# Patient Record
Sex: Female | Born: 1980 | Race: White | Hispanic: No | Marital: Married | Smoking: Never smoker
Health system: Southern US, Community
[De-identification: ages and names within clinical notes are randomized; demographics above are authoritative.]

## PROBLEM LIST (undated history)

## (undated) DIAGNOSIS — Z8739 Personal history of other diseases of the musculoskeletal system and connective tissue: Secondary | ICD-10-CM

## (undated) DIAGNOSIS — C55 Malignant neoplasm of uterus, part unspecified: Secondary | ICD-10-CM

## (undated) DIAGNOSIS — R112 Nausea with vomiting, unspecified: Secondary | ICD-10-CM

## (undated) DIAGNOSIS — J45909 Unspecified asthma, uncomplicated: Secondary | ICD-10-CM

## (undated) DIAGNOSIS — N938 Other specified abnormal uterine and vaginal bleeding: Secondary | ICD-10-CM

## (undated) DIAGNOSIS — Z9889 Other specified postprocedural states: Secondary | ICD-10-CM

## (undated) DIAGNOSIS — B880 Other acariasis: Secondary | ICD-10-CM

## (undated) HISTORY — PX: CYSTO: SHX6284

## (undated) HISTORY — DX: Unspecified asthma, uncomplicated: J45.909

---

## 2005-01-18 ENCOUNTER — Ambulatory Visit: Admission: RE | Admit: 2005-01-18 | Discharge: 2005-01-18 | Payer: Self-pay | Admitting: Gynecology

## 2005-01-19 ENCOUNTER — Ambulatory Visit (HOSPITAL_COMMUNITY): Admission: RE | Admit: 2005-01-19 | Discharge: 2005-01-19 | Payer: Self-pay | Admitting: Gynecology

## 2005-01-25 ENCOUNTER — Encounter (INDEPENDENT_AMBULATORY_CARE_PROVIDER_SITE_OTHER): Payer: Self-pay | Admitting: *Deleted

## 2005-01-25 ENCOUNTER — Ambulatory Visit (HOSPITAL_COMMUNITY): Admission: RE | Admit: 2005-01-25 | Discharge: 2005-01-25 | Payer: Self-pay | Admitting: Gynecologic Oncology

## 2005-01-25 HISTORY — PX: OTHER SURGICAL HISTORY: SHX169

## 2005-04-20 ENCOUNTER — Ambulatory Visit (HOSPITAL_COMMUNITY): Admission: RE | Admit: 2005-04-20 | Discharge: 2005-04-20 | Payer: Self-pay | Admitting: Gynecology

## 2005-06-17 ENCOUNTER — Ambulatory Visit (HOSPITAL_COMMUNITY): Admission: RE | Admit: 2005-06-17 | Discharge: 2005-06-17 | Payer: Self-pay | Admitting: Gynecology

## 2005-06-17 ENCOUNTER — Encounter (INDEPENDENT_AMBULATORY_CARE_PROVIDER_SITE_OTHER): Payer: Self-pay | Admitting: Specialist

## 2005-06-17 HISTORY — PX: OTHER SURGICAL HISTORY: SHX169

## 2005-08-10 ENCOUNTER — Ambulatory Visit: Admission: RE | Admit: 2005-08-10 | Discharge: 2005-08-10 | Payer: Self-pay | Admitting: Gynecology

## 2005-10-17 ENCOUNTER — Ambulatory Visit (HOSPITAL_COMMUNITY): Admission: RE | Admit: 2005-10-17 | Discharge: 2005-10-17 | Payer: Self-pay | Admitting: Gynecology

## 2005-10-21 ENCOUNTER — Encounter (INDEPENDENT_AMBULATORY_CARE_PROVIDER_SITE_OTHER): Payer: Self-pay | Admitting: *Deleted

## 2005-10-21 ENCOUNTER — Ambulatory Visit: Admission: RE | Admit: 2005-10-21 | Discharge: 2005-10-21 | Payer: Self-pay | Admitting: Gynecology

## 2005-10-21 ENCOUNTER — Other Ambulatory Visit: Admission: RE | Admit: 2005-10-21 | Discharge: 2005-10-21 | Payer: Self-pay | Admitting: Gynecology

## 2006-02-21 ENCOUNTER — Ambulatory Visit (HOSPITAL_COMMUNITY): Admission: RE | Admit: 2006-02-21 | Discharge: 2006-02-21 | Payer: Self-pay | Admitting: Gynecology

## 2006-02-24 ENCOUNTER — Ambulatory Visit: Admission: RE | Admit: 2006-02-24 | Discharge: 2006-02-24 | Payer: Self-pay | Admitting: Gynecology

## 2006-09-12 ENCOUNTER — Ambulatory Visit (HOSPITAL_COMMUNITY): Admission: RE | Admit: 2006-09-12 | Discharge: 2006-09-12 | Payer: Self-pay | Admitting: Gynecology

## 2006-09-15 ENCOUNTER — Ambulatory Visit: Admission: RE | Admit: 2006-09-15 | Discharge: 2006-09-15 | Payer: Self-pay | Admitting: Gynecology

## 2007-04-10 ENCOUNTER — Ambulatory Visit: Admission: RE | Admit: 2007-04-10 | Discharge: 2007-04-10 | Payer: Self-pay | Admitting: Gynecology

## 2007-04-10 ENCOUNTER — Other Ambulatory Visit: Admission: RE | Admit: 2007-04-10 | Discharge: 2007-04-10 | Payer: Self-pay | Admitting: Gynecology

## 2007-04-10 ENCOUNTER — Encounter: Payer: Self-pay | Admitting: Gynecology

## 2007-10-02 ENCOUNTER — Ambulatory Visit: Admission: RE | Admit: 2007-10-02 | Discharge: 2007-10-02 | Payer: Self-pay | Admitting: Gynecology

## 2009-02-13 ENCOUNTER — Ambulatory Visit: Admission: RE | Admit: 2009-02-13 | Discharge: 2009-02-13 | Payer: Self-pay | Admitting: Gynecology

## 2009-02-17 ENCOUNTER — Ambulatory Visit (HOSPITAL_COMMUNITY): Admission: RE | Admit: 2009-02-17 | Discharge: 2009-02-17 | Payer: Self-pay | Admitting: Gynecology

## 2009-10-30 ENCOUNTER — Ambulatory Visit (HOSPITAL_COMMUNITY): Admission: RE | Admit: 2009-10-30 | Discharge: 2009-10-30 | Payer: Self-pay | Admitting: Gynecology

## 2009-10-30 ENCOUNTER — Other Ambulatory Visit: Admission: RE | Admit: 2009-10-30 | Discharge: 2009-10-30 | Payer: Self-pay | Admitting: Gynecology

## 2010-10-17 IMAGING — US US TRANSVAGINAL NON-OB
1 series · 14 of 25 positions shown · non-contrast
Comparison: 02/17/2009

CLINICAL DATA: History of endometrial cancer, follow-up

TRANSABDOMINAL AND TRANSVAGINAL ULTRASOUND OF PELVIS
TECHNIQUE: Both transabdominal and transvaginal ultrasound
examinations of the pelvis were performed including evaluation of
the uterus, ovaries, adnexal regions, and pelvic cul-de-sac.

[Series 1: us transvaginal non-ob · 0.24mm/px · 14 of 60 slices shown]
[im 1/60]
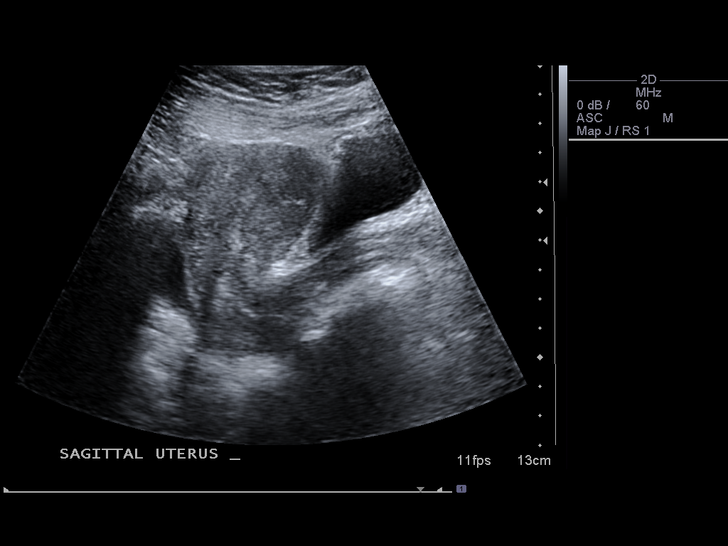
[im 5/60]
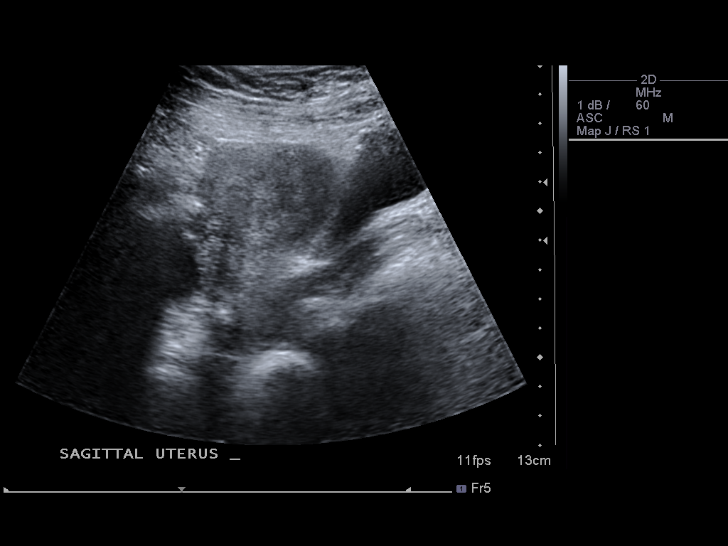
[im 10/60]
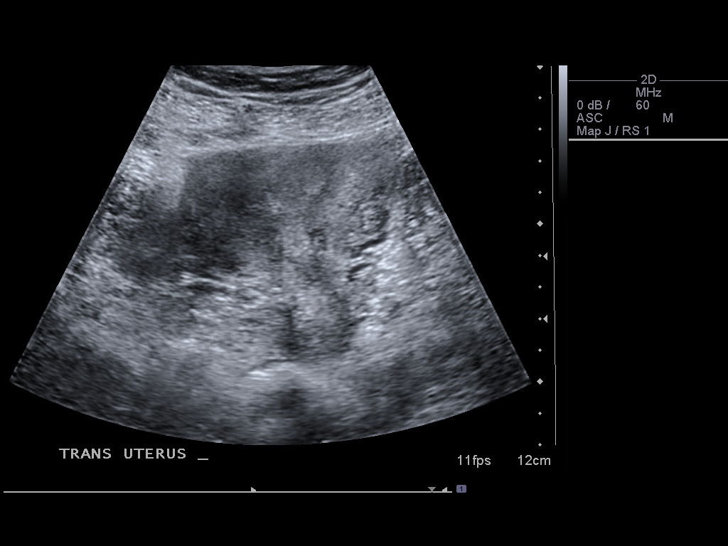
[im 15/60]
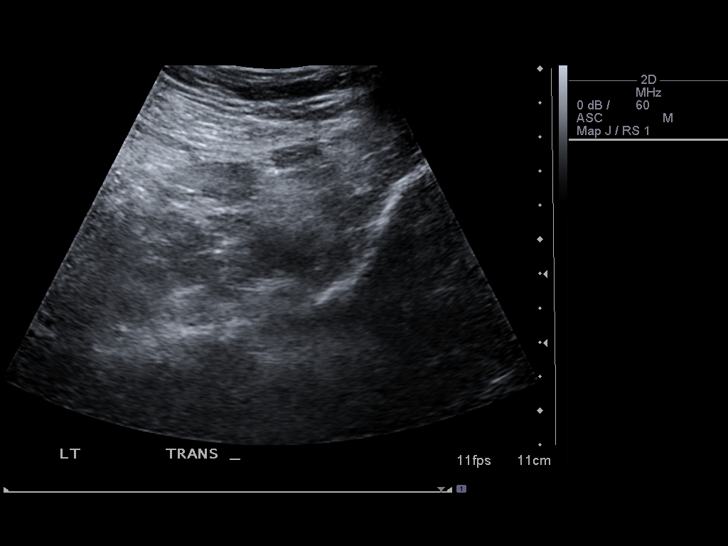
[im 20/60]
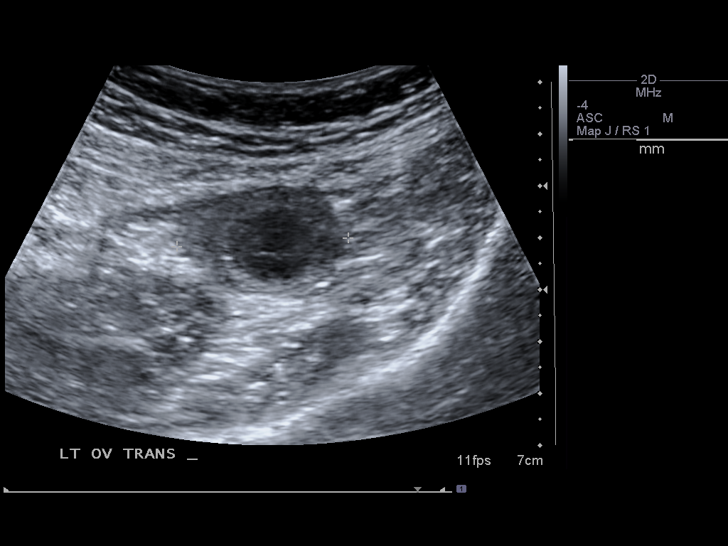
[im 23/60]
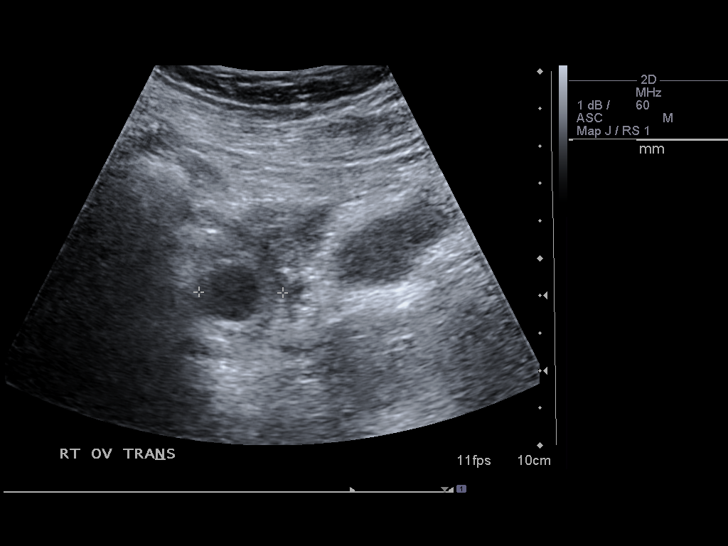
[im 28/60]
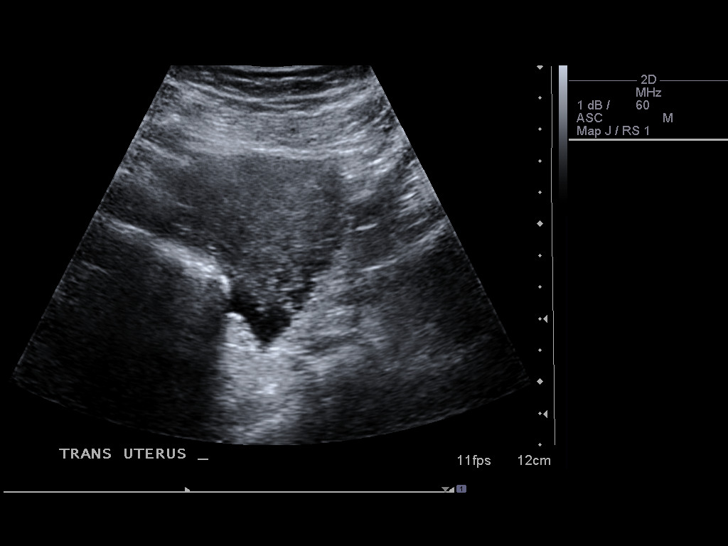
[im 32/60]
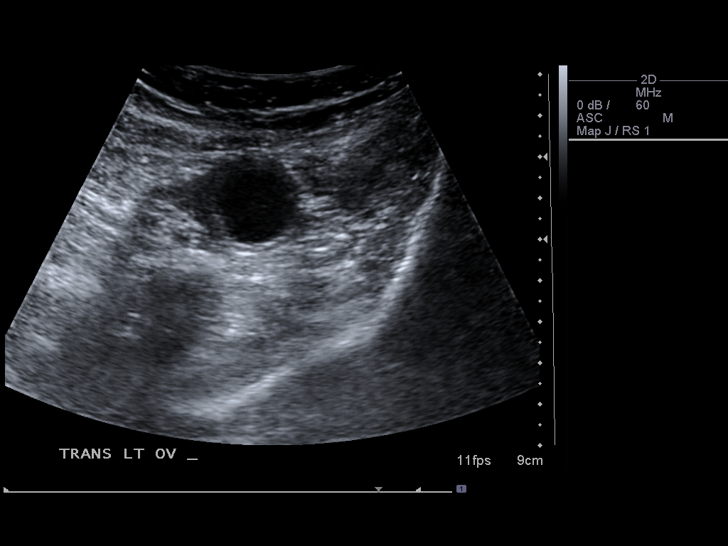
[im 37/60]
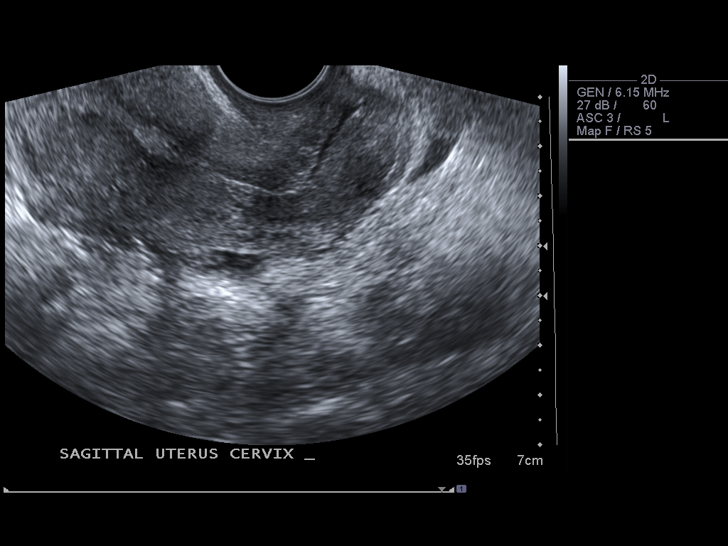
[im 40/60]
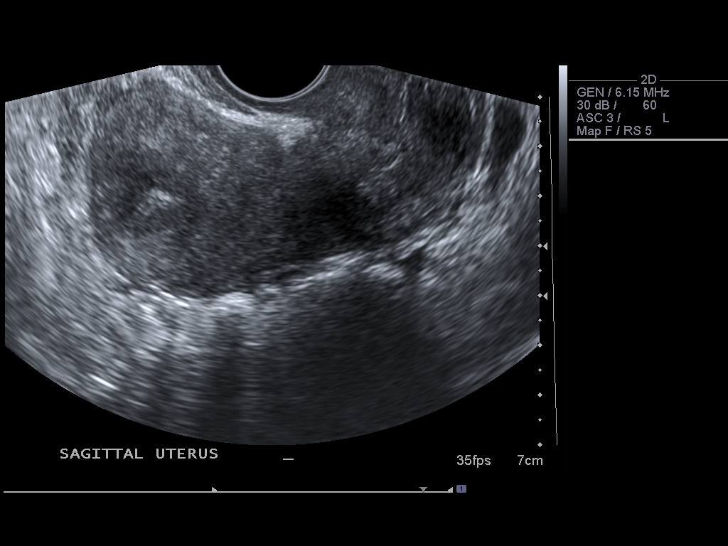
[im 45/60]
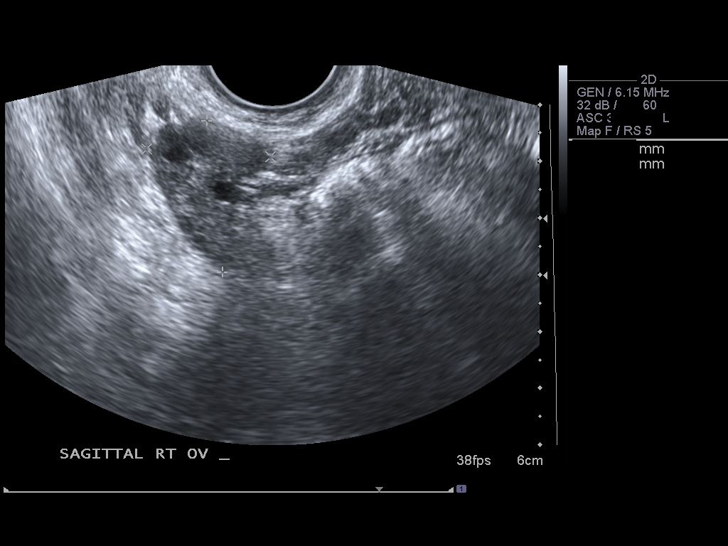
[im 50/60]
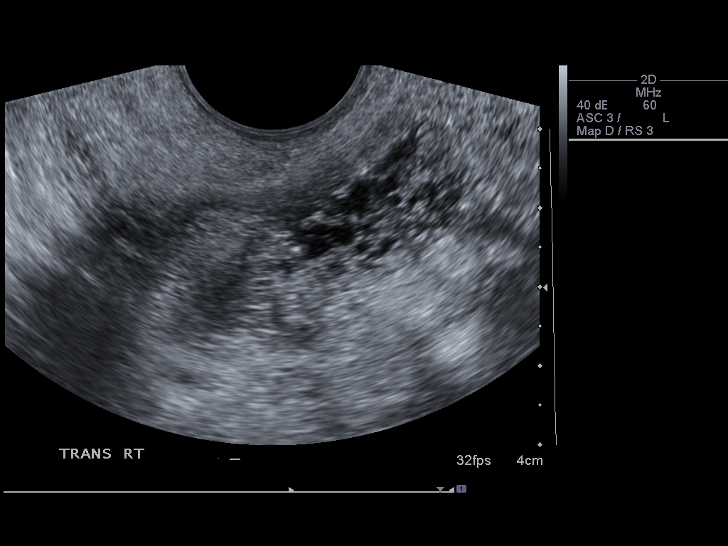
[im 55/60]
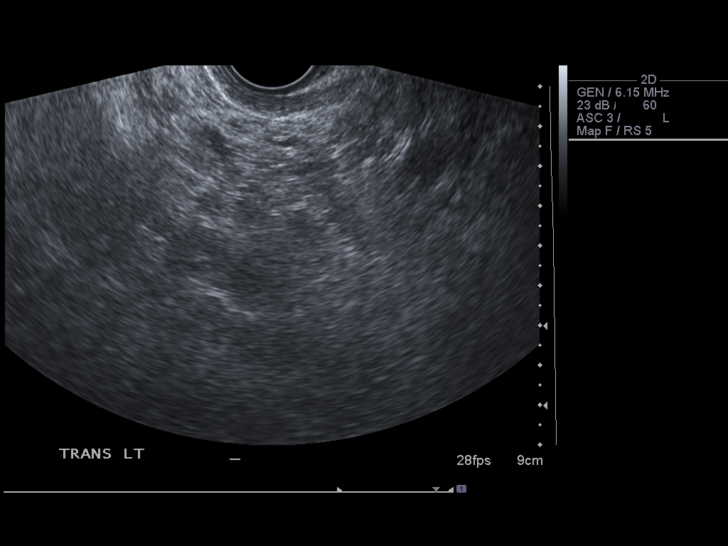
[im 60/60]
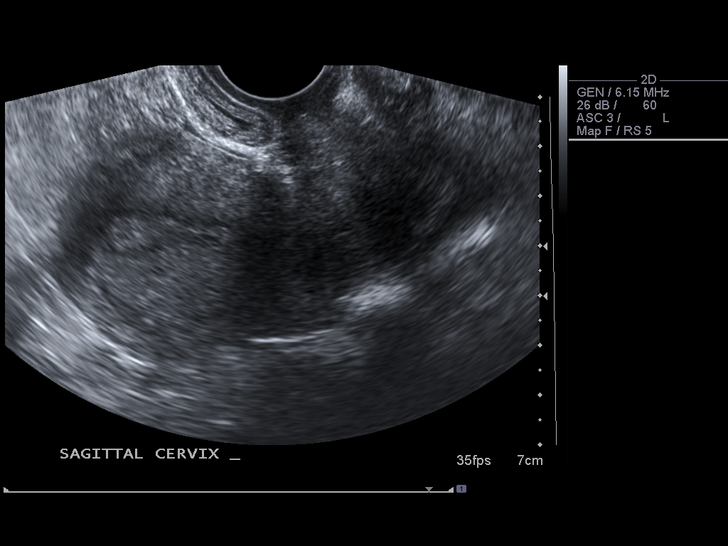

[14 of 25 positions shown; findings below may reference images not displayed]

FINDINGS: Uterus measures 7.2 x 5.1 x 4.0 cm.  Anteverted, anteflexed,
unremarkable.

Endometrium 0.9 cm, uniformly echogenic.

Right Ovary 2.7 x 2.2 x 1.8 cm, normal.

Left Ovary 4.8 x 3.3 x 2.2 cm, normal.

Other Findings:  Small amount of free fluid incidentally noted in
the cul-de-sac.
IMPRESSION: Normal exam.

## 2010-12-11 ENCOUNTER — Encounter: Payer: Self-pay | Admitting: Gynecology

## 2011-04-05 NOTE — Consult Note (Signed)
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   INTERVAL HISTORY:  The patient returns today for continuing gynecologic  followup.  Since our last visit, the patient became pregnant and then  delivered a 5 pound female infant by cesarean section.   Pregnancy was complicated by placenta previa and breech presentation.  The baby spent some time in the NICU but now is at home and doing fine.  The patient herself breast fed only for one month and now is bottle  feeding.  She has had several menstrual periods at regular intervals  since that time.  She is currently using no contraception.   HISTORY OF PRESENT ILLNESS:  The patient initially presented with heavy  vaginal bleeding and underwent a D and C in February 2006.  Final  pathology showed a Mullerian adenosarcoma.  She wished to preserve  fertility and we therefore gave her progestins.  Followup D and C in  March 2009 showed a minute focus of adenosarcoma and a D and C in July  2006 showed benign secretory endometrium but no evidence of sarcoma.  She was then followed with ultrasounds every six months with no  thickening of the endometrial stripe and no abnormalities of the uterus  or cervix.  She subsequently delivered a baby as noted above.   PAST MEDICAL HISTORY:  Medical illnesses: None.   PAST SURGICAL HISTORY:  1. D and C.  2. Cystoscopic bladder surgery as a child.   DRUG ALLERGIES:  NONE.   FAMILY HISTORY:  Grandmother with small cell lung cancer.   CURRENT MEDICATIONS:  None.   SOCIAL HISTORY:  The patient is married.  She works for an [employer].  She does not  smoke.   OBSTETRICAL HISTORY:  Gravida 1.   REVIEW OF SYSTEMS:  A ten point comprehensive review of systems was  negative except as noted above.   PHYSICAL EXAMINATION:  VITAL SIGNS: Weight 157 pounds, blood pressure  100/60.  GENERAL: The patient is a healthy white female, in no acute distress.  HEENT: Negative.  NECK: Supple without thyromegaly.  LYMPH NODES: There is no supraclavicular or inguinal adenopathy.  ABDOMEN: Soft and nontender.  No masses, organomegaly, ascites, or  hernia noted.  Her Pfannenstiel incision is healing well.  She also has  a small incision above the umbilicus where she reports she had a  dysplastic nevus removed during pregnancy.  PELVIC EXAM: EG/BUS, vagina, bladder, urethra are normal.  Cervix  appears normal.  Uterus is anterior, normal shape, size, and  consistency.  There are no adnexal masses noted.  RECTOVAGINAL EXAM:  Confirms.  LOWER EXTREMITIES: Without edema or varicosities.   IMPRESSION:  Mullerian adenosarcoma of the uterus.  No evidence of  recurrent disease.  PLAN:  Ultrasound will be obtained in the next week for a new baseline  and we will repeat it again in six months.   The patient is desirous of having birth control and is given a  prescription for Ortho Tri-Cyclen for both birth control and hopefully  suppression of any endometrial adenosarcoma.   She will return to see me in six months for continuing followup.      De Blanch, M.D.  Electronically Signed     DC/MEDQ  D:  02/13/2009  T:  02/13/2009  Job:  [id number]   cc:   Telford Nab, R.N.  [address]  [place], [place] [address]   Dr. Orma Flaming  [address] [place], [place] [address]

## 2011-04-05 NOTE — Consult Note (Signed)
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   INTERVAL HISTORY:  The patient returns today for continuing followup.  She discontinued using birth control pills in May of 2008.  Subsequently, she has had regular cyclic menstrual periods.  She denies  any intermenstrual bleeding.  She denies any pelvic pain, pressure, or  any GI or GU symptoms.  She is sexually active and she and her husband  in the last 2 to 3 weeks are beginning to attempt pregnancy.   She had a normal ultrasound earlier today that shows a thin endometrial  stripe and no other abnormalities.   HISTORY OF PRESENT ILLNESS:  Patient initially presented with heavy  vaginal bleeding and underwent a D&C in February of 2006.  Final  pathology showed a Mullerian adenosarcoma.  Because she wished to  preserve fertility, we followed her conservatively doing a followup D&C  in March of 2006 showing a minute focus of adenosarcoma.  D&C in July of  2006 was benign secretory endometrium and no evidence of sarcoma.  She  has been followed since that time with serial ultrasounds.  She has a  thin endometrial stripe and no other abnormalities.   PAST MEDICAL HISTORY/MEDICAL ILLNESSES:  None.   PAST SURGICAL HISTORY:  1. D&C.  2. Cystoscopic bladder surgery as a child.   DRUG ALLERGIES:  NONE.   FAMILY HISTORY:  Mother with small cell carcinoma of the lung.  Grandmother with small cell carcinoma of the lung.   CURRENT MEDICATIONS:  None except for a multivitamin with folic acid.   SOCIAL HISTORY:  The patient is married.  She works at an [employer].  Does  not smoke.   OBSTETRICAL HISTORY:  Gravida 0.   REVIEW OF SYSTEMS:  Ten-point comprehensive review of systems negative  except as noted above.   PHYSICAL EXAM:  Weight 148 pounds.  GENERAL:  The patient is a healthy, white female in no acute distress.  HEENT:  Negative.  NECK:  Supple without thyromegaly.  There is no supraclavicular or  inguinal adenopathy.  ABDOMEN:  Soft and nontender.  No masses, organomegaly, ascites, or  hernias noted.  PELVIC:  EG/BUS.  Vagina, bladder, and urethra are normal.  Cervix and  uterus are normal.  There are no adnexal masses.  Rectovaginal exam  confirms.  LOWER EXTREMITIES:  Without edema or varicosities.   IMPRESSION:  Adenosarcoma, being managed conservatively.  No evidence of  recurrent disease following dilation and curettages.   The patient is encouraged to pursue pregnancy.  I will plan on seeing  her back again in 6 months and have repeat CT pelvic ultrasound looking  at the uterus at that time.      De Blanch, M.D.  Electronically Signed     DC/MEDQ  D:  10/02/2007  T:  10/03/2007  Job:  [id number]   cc:   Telford Nab, R.N.  [address]  [place], [place] [address]   Wilmon Pali, M.D.   Buford Dresser, M.D.

## 2011-04-05 NOTE — Consult Note (Signed)
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   INTERVAL HISTORY:  Since her last visit, the patient has done well.  She  has no gynecologic complaints.  She has regular cyclic menstrual  periods.  She continues to use Ortho-Tri-Cyclen.  She and her husband  have not decided when they want to start a family.  She has no  intermenstrual spotting or pelvic pain.   The patient had an ultrasound earlier today that shows a thin  endometrial stripe and no other abnormalities.   HISTORY OF PRESENT ILLNESS:  The patient presented with heavy vaginal  bleeding and underwent a D&C in February of 2006.  Final pathology  showed a mullerian adenosarcoma.  She wished to preserve fertility, and  therefore we took a conservative approach to suppress her endometrium  with progestin and continuing oral contraceptives.  Followup D&C in  March of 2006 showed a minute focus of adenosarcoma.  A D&C in July of  2006 showed benign secretory endometrium with no evidence of sarcoma.  She has been followed since that time with ultrasounds showing a thin  endometrial stripe and no abnormalities.  She has also had no new  symptoms.   PAST MEDICAL HISTORY:  Medical illnesses:  None.   PAST SURGICAL HISTORY:  1. D&C.  2. Cystoscopic bladder surgery as a child.   ALLERGIES:  None.   FAMILY HISTORY:  Grandmother with small cell cancer of the lung.  There  is no gynecologic, breast, or colon cancer in the family.   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   OBSTETRICAL HISTORY:  Gravida 0.   REVIEW OF SYSTEMS:  A 10-point comprehensive review of systems negative  except as noted above.   PHYSICAL EXAMINATION:  VITAL SIGNS:  Weight 147.5 pounds, blood pressure  114/73, pulse 80, respiratory rate 20.  GENERAL:  The patient is a healthy white female in no acute distress.  HEENT:  Negative.  NECK:  Supple without thyromegaly.  There is no supraclavicular or  inguinal adenopathy.  ABDOMEN:  Soft and nontender.  No masses, organomegaly, ascites, or  hernias noted.  PELVIC EXAM:  EG-BUS and vagina regular, urethra normal, the cervix and  uterus are normal, the adnexa are without masses.  LOWER EXTREMITIES:  Without edema or varicosities.   IMPRESSION:  Adenosarcoma of the uterus.  No evidence of persistent or  recurrent disease.   PLAN:  Pap smears were obtained.  The patient will continue using the  Ortho-Tri-Cyclen.  She will return to see me in six months.   We did discuss the possibility of  the patient attempting pregnancy.  I  think now with over two years of followup and no evidence of recurrent  disease this would be okay.  The patient and her husband are uncertain  as to their fertility wishes at the present time, and therefore the  patient will continue using oral contraceptives.   She will return to see me in six months.      De Blanch, M.D.  Electronically Signed     DC/MEDQ  D:  04/10/2007  T:  04/10/2007  Job:  [id number]

## 2011-04-08 NOTE — Consult Note (Signed)
[name], [name]             ACCOUNT NO.:  [id number]   MEDICAL RECORD NO.:  [id number]        PATIENT TYPE:  LOUT   LOCATION:                                 FACILITY:   PHYSICIAN:  De Blanch, M.D.DATE OF BIRTH:  05-07-1981   DATE OF CONSULTATION:  DATE OF DISCHARGE:                                   CONSULTATION   A 30 year old white married female seen in consultation regarding management  of an adenosarcoma recently diagnosed at the time of D&C.  The patient  initially present with heavy vaginal bleeding and a lesion protruding  through a dilated cervical os.  She underwent an urgent D&C by her  gynecologist, Buford Dresser, MD, of [facility].  The operative note showed a  relatively large uterus of approximately 9 cm in depth.  Her hemoglobin had  fallen to 6.9 g.  Final pathology has been reviewed by Dr. Al Decant. Young  at [facility] who feels that this is a mullerian  adenosarcoma.  There was a considerable amount of tumor which was polypoid  and in total measured 4.0 x 2.0 x 2.2 cm.  The patient has had an  uncomplicated postoperative course and has minimal bleeding.   PAST MEDICAL HISTORY:   MEDICAL ILLNESSES:  None.   PAST SURGICAL HISTORY:  1.  D&C.  2.  Some form of cystoscopic procedure as a child for recurrent urinary      tract infections.   DRUG ALLERGIES:  None.   FAMILY HISTORY:  The patient has a grandmother with small cell lung cancer.   CURRENT MEDICATIONS:  Oral contraceptives and iron b.i.d.   SOCIAL HISTORY:  The patient has been married since September 2005.  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   OBSTETRICAL HISTORY:  Gravida 0.   REVIEW OF SYSTEMS:  Otherwise negative.   PHYSICAL EXAMINATION:  VITAL SIGNS:  Weight 136 pounds, height 5 feet 6  inches, blood pressure 110/70.  GENERAL:  The patient is a healthy young woman in no acute distress.  HEENT:  Negative.  NECK:  Supple  without thyromegaly.  There is no supraclavicular or inguinal  adenopathy.  ABDOMEN:  Soft, nontender.  No masses, organomegaly, ascites, or hernias are  noted.  PELVIC:  EG/BUS, vagina, bladder, urethra are normal.  Cervix appears  normal, and there are no lesions noted.  Uterus is anterior, normal shape,  size, and consistency.  Adnexa without masses.  Rectovaginal exam confirms.   IMPRESSION:  Mullerian adenosarcoma of the uterus, status post D&C.  I  indicated to the patient, her mother, husband, and mother-in-law that the  standard care for these lesions would be to perform a hysterectomy.  Obviously this is devastating news to a young woman who wishes to preserve  fertility.  We did discuss alternative approaches to managing this lesion  and although they are not considered standard, the patient is willing to  accept some risks involved.  Taking an alternative approach, I would suggest  the patient undergo an MRI of the pelvis to evaluate for any metastatic  disease and even more importantly, to determine whether there is any  significant myometrial invasion.  If there was myometrial invasion or  involvement, then the hysterectomy would be, I think, our only reasonable  choice.  On the other hand, if she did not have myometrial involvement, I  would recommend she have a follow up D&C with aggressive attempts to  curettage the endometrial cavity and to ascertain whether there is any  residual disease.  Estrogen and progesterone receptors would be obtained.  If she is ER/PR positive, then I would attempt to suppress her with  progestins for 3 months and then repeat an MRI and a D&C for further  histologic evaluation.  All the participants of this discussion including  the patient understand that this is not the standard of care but would be  our attempt to preserve fertility, accepting some risks of persistent or  recurrent disease.  All of their questions are answered.  We will  arrange  for her to have an MRI in the next week or so, and then we will schedule the  [facility] thereafter if the MRI is favorable.   All of their questions are answered.      DC/MEDQ  D:  01/18/2005  T:  01/18/2005  Job:  [id number]   cc:   Buford Dresser, MD  [facility] Gynecology [facility]  [address]  [place]  [place] [address]   Telford Nab, R.N.  [address]  [place], [place] [address]

## 2011-04-08 NOTE — Consult Note (Signed)
[name], [name]             ACCOUNT NO.:  [id number]   MEDICAL RECORD NO.:  [id number]          PATIENT TYPE:  OUT   LOCATION:  GYN                          FACILITY:  [facility]   PHYSICIAN:  De Blanch, M.D.DATE OF BIRTH:  Apr 16, 1981   DATE OF CONSULTATION:  02/24/2006  DATE OF DISCHARGE:                                   CONSULTATION   CHIEF COMPLAINT:  Mullerian adenocarcinoma of the uterus.   HISTORY (INTERVAL) OF PRESENT ILLNESS:  Since her last visit, the patient  has done well. She continues to take Ortho-Tri-Cyclen for contraception and  suppression of her endometrium. She has had regular cyclic menses, although  she had heavier menstrual period in February. She denies any pelvic pain or  pressure, any other GI or GU symptoms.   The patient had a followup ultrasound on February 21, 2006, which shows a  sonographically normal appearing uterus and a 4 mm endometrial strip.  Ovaries appear normal. There is no abnormalities noted in the pelvis.   HISTORY OF PRESENT ILLNESS:  The patient initially presented with heavy  vaginal bleeding and underwent a D&C in February 2006. Final pathology  showed a mullerian adenosarcoma. She was strongly desirous of preserving  fertility. A followup D&C in March of 2006 showed a minuet focus of residual  adenosarcoma. We then subsequently placed the patient on hormonal  suppression. A second D&C in July showed benign secretory endometrium with  no evidence of sarcoma. She has continued to be suppressed with Ortho-Tri-  Cyclen, having regular cyclic menstrual periods and followup ultrasounds  that are showing no uterine abnormalities.   PAST MEDICAL HISTORY/MEDICAL ILLNESSES:  None.   PAST SURGICAL HISTORY:  D&C, cystoscopic bladder surgery as a child.   ALLERGIES:  NO KNOWN DRUG ALLERGIES.   FAMILY HISTORY:  Grandmother with small cell lung cancer.   CURRENT MEDICATIONS:  1.  Ortho-Tri-Cyclen.  2.  Iron.   SOCIAL HISTORY:   The patient is married. 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 She and her husband are continuing to  restore their home in [place].   OBSTETRIC HISTORY:  Gravida O.   REVIEW OF SYSTEMS:  Ten point comprehensive review of systems negative  except as noted above.   PHYSICAL EXAMINATION:  VITAL SIGNS:  Weight 142 pounds. Blood pressure  110/70, pulse 80, respiratory rate 20.  GENERAL:  A healthy white young woman in no acute distress.  HEENT:  Negative.  NECK:  Supple without thyromegaly. There is no supraclavicular or inguinal  adenopathy.  ABDOMEN:  Soft, nontender. No masses, organomegaly, ascites, or hernias are  noted.  PELVIC:  EG, BUS, vagina, bladder, and urethra are normal. Cervix appears  normal. No lesions are noted. Bi-manual reveals a small uterus which seems  to be retroverted today. No adnexal masses noted.  RECTAL:  Recto-vaginal examination confirms.   IMPRESSION:  Mullerian adenosarcoma of the uterus. No evidence of recurrent  disease. Ultrasound is very reassuring.   PLAN:  She will continue using Ortho-Tri-Cyclen. We will repeat her  ultrasound in 6 months.  De Blanch, M.D.  Electronically Signed     DC/MEDQ  D:  02/24/2006  T:  02/25/2006  Job:  [id number]   cc:   Telford Nab, R.N.  [address]  [place], [place] [address]   Betsy English   [address]  [place], [place].

## 2011-04-08 NOTE — Consult Note (Signed)
[name], BILELLO             ACCOUNT NO.:  [id number]   MEDICAL RECORD NO.:  [id number]          PATIENT TYPE:  AMB   LOCATION:  DAY                          [facility]  [facility]   PHYSICIAN:  De Blanch, M.D.DATE OF BIRTH:  [birth year]   DATE OF CONSULTATION:  DATE OF DISCHARGE:  01/25/2005                                   CONSULTATION   I contacted the patient today by phone and also talked to her mother,  Junious Dresser.  We have reviewed her pathology from recent D&C, which shows a  minute focus of residual adenosarcoma.   We continue agree with the plan of conservative management in hopes of  preserving fertility.  The patient will remain on Micronor.  She will report  any new symptoms, including bleeding, cramping, or abdominal pain.  If all  goes well, we will repeat an MRI in three months and then perform another  D&C.   All of the patient's questions are answered.   I called the patient's mother, Junious Dresser, separately and had a lengthy  conversation with the patient's approval.  We reviewed a number of issues,  including fertility, and the recommendation of performing a hysterectomy  once the patient had completed her fertility.  She is aware that this is not  a standard recommendation for management of an adenosarcoma, but with hopes  of preserving fertility, we have agreed to take this route.  They understand  that there is no 100% guarantee as to the potential outcomes.      DC/MEDQ  D:  02/07/2005  T:  02/07/2005  Job:  [id number]   cc:   Telford Nab, R.N.  [address]  [place], [place] [address]

## 2011-04-08 NOTE — Op Note (Signed)
NAMENADYA, HOPWOOD             ACCOUNT NO.:  [id number]   MEDICAL RECORD NO.:  [id number]          PATIENT TYPE:  AMB   LOCATION:  DAY                          FACILITY:  [facility]   PHYSICIAN:  De Blanch, M.D.DATE OF BIRTH:  08/10/1981   DATE OF PROCEDURE:  06/17/2005  DATE OF DISCHARGE:                                 OPERATIVE REPORT   PREOPERATIVE DIAGNOSIS:  Adenosarcoma of the uterus currently on progestin  suppression.   POSTOPERATIVE DIAGNOSIS:  Adenosarcoma of the uterus currently on progestin  suppression.   PROCEDURE:  Examination under anesthesia, diagnostic hysteroscopy,  fractional D&C.   SURGEON:  De Blanch, M.D.   ANESTHESIA:  LMA.   SURGICAL FINDINGS:  Examination under anesthesia revealed an anterior uterus  which is upper limits of normal size. The uterus sounded 8 cm anteriorly. At  the time of hysteroscopy, there were some shaggy endometrium noted in the  fundus. The cervix appeared normal.   DESCRIPTION OF PROCEDURE:  The patient was brought to the operating room and  after satisfactory attainment of general anesthesia was placed in the  lithotomy position in candy cane stirrups. The vulva and vagina were prepped  with Betadine, the patient was draped. A weighted speculum was placed and  the cervix was grasped with a tenaculum. The uterus sounded 8 cm anteriorly.  The cervix was then dilated. A hysteroscope was introduced and a survey  obtained of the cervical canal and endometrium with findings noted above.  The hysteroscope was removed and a deficit of 50 mL of infusion medium was  noted. A fractional D&C was performed obtaining separate specimens from the  endocervix and endometrium. Following complete curettage of the endometrium,  Randall stone forceps were introduced and additional endometrial material  collected and submitted to pathology. The tenaculum  was removed, weighted speculum removed, bimanual exam was performed  confirming normal size uterus. Bleeding was minimal. The patient was  awakened from anesthesia and taken to the recovery room in satisfactory  condition. Sponge, needle and instrument counts were correct x2.       DC/MEDQ  D:  06/17/2005  T:  06/17/2005  Job:  [id number]   cc:   Wilmon Pali, M.D.  [address]  [place], [place] [address]   Buford Dresser, M.D.  [address]  [place], [place] [address]   Telford Nab, R.N.  [address]  [place], [place] [address]

## 2011-04-08 NOTE — Consult Note (Signed)
[name], [name]             ACCOUNT NO.:  [id number]   MEDICAL RECORD NO.:  [id number]          PATIENT TYPE:  OUT   LOCATION:  GYN                          FACILITY:  [facility]   PHYSICIAN:  De Blanch, M.D.DATE OF BIRTH:  Oct 31, 1981   DATE OF CONSULTATION:  DATE OF DISCHARGE:  08/10/2005                                   CONSULTATION   CHIEF COMPLAINT:  Adenosarcoma of the endometrium.   INTERVAL HISTORY:  The patient underwent D&C on June 17, 2005, which was  entirely benign.  Since then, she has continued taking Micronor and has  regular cyclic menses.  She had no sequelae from her D&C.   HISTORY OF PRESENT ILLNESS:  Patient underwent an urgent D&C because of  heavy vaginal bleeding on January 05, 2005.  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  The patient strongly desires preserving fertility.  The tumor was E/R and P/R positive, and she was therefore placed on Micronor  with the attempt at using suppression of progesterones.  Prior to initiation  therapy, she had an MRI which showed no evidence of invasive disease.  Subsequently, she underwent a follow-up hysteroscopy and D&C on January 25, 2005, which showed a minute focus of residual adenosarcoma with fragments of  proliferative endometrium.  She continued on Micronor and had a follow-up  MRI on Apr 20, 2005, which showed a normal uterus.  She subsequently  underwent a second D&C on July 28th, which showed benign secretory  endometrium and no evidence of sarcoma.   PAST MEDICAL HISTORY:  Medical illnesses none.   PAST SURGICAL HISTORY:  1.  D&C x3.  2.  Cystoscopic bladder surgery as a child.   DRUG ALLERGIES:  None.   FAMILY HISTORY:  Patient's grandmother had small cell lung cancer.   CURRENT MEDICATIONS:  Micronor.  Iron.   SOCIAL HISTORY:  Patient was married in September, 2005.  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   OBSTETRICAL HISTORY:  Gravida 0.   REVIEW OF SYSTEMS:  Negative except as noted above.   PHYSICAL EXAMINATION:  VITAL SIGNS:  Weight 138 pounds.  ABDOMEN:  Soft and nontender.  No mass, organomegaly, ascites, or hernias  are noted.  PELVIC:  EG/BUS, vagina, bladder, and urethra are normal.  The cervix is  normal.  There are no lesions noted.  There is no bleeding.  The uterus is  anterior and normal in shape, size, and consistency.  There are no adnexal  masses noted.  Rectovaginal exam confirms.   IMPRESSION:  Adenosarcoma of the endometrium, which appears to be remission  following dilatation and curettage and suppression using Micronor.   PLAN:  We will continue Micronor and reassess her endometrial stripe in  December, 2006.  She will report any abnormal bleeding to [place].      De Blanch, M.D.  Electronically Signed     DC/MEDQ  D:  08/16/2005  T:  08/16/2005  Job:  [id number]   cc:   Telford Nab,  R.N.  [address]  [place], [place] [address]   Dr. Tamela Oddi English  [address]  [place], [place] [address]   Tempie Hoist, M.D.  [address]  [place], [place] [address]

## 2011-04-08 NOTE — Op Note (Signed)
[name], [name]             ACCOUNT NO.:  [id number]   MEDICAL RECORD NO.:  [id number]          PATIENT TYPE:  AMB   LOCATION:  DAY                          FACILITY:  [facility]   PHYSICIAN:  Paola A. Duard Brady, MD    DATE OF BIRTH:  17-Oct-1981   DATE OF PROCEDURE:  01/25/2005  DATE OF DISCHARGE:                                 OPERATIVE REPORT   PREOPERATIVE DIAGNOSIS:  Adenosarcoma of the endometrium.   POSTOPERATIVE DIAGNOSIS:  Adenosarcoma of the endometrium.   PROCEDURE:  Hysteroscopy, endocervical curettage, D&C.   SURGEON:  Paola A. Duard Brady, M.D.   ASSISTANT:  None.   ANESTHESIA:  General.   ESTIMATED BLOOD LOSS:  Minimal.   FLUIDS:  Hysteroscopic fluid in 300 cc, hysteroscopic fluid out 270 cc.   FINDINGS:  No evidence of disease within the endometrial cavity.  Primarily  blood with minimal tissue obtained at the time of D&C.   DESCRIPTION OF PROCEDURE:  The patient was taken to the operating room after  proper identification and review of consent.  She was placed in the supine  position where general anesthesia was induced.  She was then placed in the  dorsal lithotomy position with her legs in Annetta North stirrups, with all  appropriate precautions.  The perineum was prepped in the usual sterile  fashion.  A Foley catheter was inserted to drain the bladder as a straight  drain.   A speculum was then placed into the vagina.  There were no gross visible  lesions.  The cervix was identified and grasped with a single-tooth  tenaculum.  The endometrial cavity sounded to 8 cm.  The cervix was dilated  to 25 without difficulty.  Hysteroscopy was then performed.  There were no  gross visible lesions within the endometrial canal.  Endocervical curettage  was then performed after removal of the hysteroscope, and D&C was also  performed.  Hysteroscopy was then repeated post-completion of the D&C.  There was a remaining shaggy endometrium.  The  hysteroscope was removed.  The  single-tooth tenaculum was removed, and there  was no bleeding from the tenaculum site.   She tolerated the procedure well.  I&Os were as above.  The patient went to  the recovery room in stable condition.      PAG/MEDQ  D:  01/25/2005  T:  01/25/2005  Job:  [id number]

## 2011-04-08 NOTE — Consult Note (Signed)
NAMESRINIDHI, LANDERS             ACCOUNT NO.:  [id number]   MEDICAL RECORD NO.:  [id number]          PATIENT TYPE:  OUT   LOCATION:  GYN                          FACILITY:  [facility]   PHYSICIAN:  De Blanch, M.D.DATE OF BIRTH:  [birth year]   DATE OF CONSULTATION:  10/21/2005  DATE OF DISCHARGE:                                   CONSULTATION   CHIEF COMPLAINT:  Adenosarcoma of the uterus.   INTERVAL HISTORY:  Since her last visit, the patient has been using Orth Tri-  Cyclen. She has regular cyclic menses. She denies any intermenstrual  bleeding. She denies any pelvic pain, pressure or any other GI or GU  symptoms. She had a followup ultrasound on November 27 which showed an  endometrial stripe measuring 5 mm, no masses or fluid collection was seen.  Overall she is doing quite well.   HISTORY OF PRESENT ILLNESS:  The patient initially presented with heavy  vaginal bleeding and had a D&C on January 05, 2005. Final pathology showed  this to be a mullerian adenocarcinoma. The patient strongly desires to  preserve infertility. The tumor is ER and PR positive and we therefore  placed her on Micronor initially. Subsequently because of poor tolerance of  Micronor, we switched her to [employer]. Followup MRIs in March and May  of 2006 showed no increased thickening and an ultrasound on November 27  showed a normal endometrial stripe. She underwent a hysteroscopy D&C January 25, 2005 which showed a minute focus of residual adenosarcoma with fragments  of proliferative endometrium, second D&C in July showed benign secretory  endometrium with no evidence of sarcoma. We are continuing to follow the  patient conservatively with close surveillance.   PAST MEDICAL HISTORY:  Medical illnesses none.   PAST SURGICAL HISTORY:  D&C x3, cystoscopic bladder surgery as a child.   DRUG ALLERGIES:  None.   FAMILY HISTORY:  The patient's grandmother had small cell lung cancer.   CURRENT  MEDICATIONS:  Cathren Harsh and iron.   SOCIAL HISTORY:  The patient is married. 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 She and her husband are currently  restoring an old house.   OBSTETRICAL HISTORY:  Gravida 0.   REVIEW OF SYMPTOMS:  A 10 point comprehensive review of systems is negative  except as noted above.   PHYSICAL EXAMINATION:  VITAL SIGNS:  Weight 144 pounds, blood pressure  110/70, pulse 80, respiratory rate 20.  GENERAL:  The patient is a healthy young woman in no acute distress.  HEENT:  Negative.  NECK:  Supple without thyromegaly.  ABDOMEN:  Soft, nontender, no masses, organomegaly, ascites or hernias are  noted.  PELVIC:  EGBUS, vagina, bladder, urethra are normal. Cervix is nulliparous,  appears normal. Pap smears are obtained. Bimanual reveals a small uterus,  anterior, normal shape, size and consistency. There are no adnexal masses  noted.   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   PLAN:  We will continue to keep the patient suppressed using Alice Rieger Tri-  Cyclen. She will return in February and have a repeat ultrasound for  assessment of the endometrial stripe. At that juncture if all looks good, we  will increase her interval with surveillance to 6 month intervals.      De Blanch, M.D.  Electronically Signed     DC/MEDQ  D:  10/21/2005  T:  10/21/2005  Job:  [id number]   cc:   Telford Nab, R.N.  [address]  [place], [place] [address]   Betsy English   Buford Dresser  [place], [place]

## 2011-04-08 NOTE — Consult Note (Signed)
[name], [name]             ACCOUNT NO.:  [id number]   MEDICAL RECORD NO.:  [id number]          PATIENT TYPE:  OUT   LOCATION:  GYN                          FACILITY:  [facility]   PHYSICIAN:  De Blanch, M.D.DATE OF BIRTH:  12/06/1980   DATE OF CONSULTATION:  09/15/2006  DATE OF DISCHARGE:                                   CONSULTATION   GYNECOLOGIC/ONCOLOGY CLINIC   CHIEF COMPLAINT:  Mullerian adenosarcoma of the uterus.   INTERVAL HISTORY:  Since her last visit, the patient has done well.  She  continues to take Ortho Tri-Cyclen for contraception and suppression of her  endometrium.  She has no interval bleeding or other pelvic symptoms except  for some discomfort in the last few days in the right lower quadrant.   She has had a followup ultrasound of the uterus on October 23, showing a 5.2  mm endometrial strip, no discrete masses identified and there is no  significant change when compared to her ultrasound of February 21, 2006.  On the  ultrasound, they did note a small functional cyst on the right ovary which  is likely a source of her current pain.   HISTORY OF PRESENT ILLNESS:  The patient initially presented with heavy  vaginal bleeding and underwent a D&C in February 2006.  Final pathology  showed a mullerian adenosarcoma.  She and her husband wish to preserve  fertility and therefore undertook a conservative program suppressing the  endometrium with Progestin containing oral contraceptives.  A followup D&C  in March 2006, showed a minute focus of adenosarcoma and a D&C in July 2006,  showed benign secretory endometrium with no evidence of sarcoma.  She had  been followed since that time.   PAST MEDICAL HISTORY:  No medical illnesses.   PAST SURGICAL HISTORY:  1. D&C.  2. Cystoscopic bladder surgery as a child.   ALLERGIES:  No known drug allergies.   FAMILY HISTORY:  Grandmother with small cell cancer of the lung.   CURRENT MEDICATIONS:  Ortho  Tri-Cyclen and iron.   SOCIAL HISTORY:  The patient is married.  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   PAST OBSTETRICAL HISTORY:  G0.  The patient and her husband are not  currently actively attempting pregnancy because of financial constraints.   REVIEW OF SYSTEMS:  Ten-point comprehensive review of systems is negative  except as noted above.   PHYSICAL EXAMINATION:  VITAL SIGNS:  Blood pressure 110/70, pulse 80,  respirations 20.  GENERAL:  The patient is a healthy, slender, young woman in no acute  distress.  HEENT:  Negative.  NECK:  Supple without thyromegaly.  There is no supraclavicular or inguinal  adenopathy.  ABDOMEN:  Soft, nontender.  No masses, organomegaly, ascites or hernias  noted.  PELVIC:  EG/BUS, vagina, bladder, urethra are normal.  Cervix is  nulliparous.  Uterus is anterior and normal shape, size or consistency.  There are no adnexal masses noted.  EXTREMITIES:  Lower extremities without edema or varicosities.   IMPRESSION:  Mullerian adenosarcoma of the uterus, no evidence of  disease.   PLAN:  The patient will continue her Ortho Tri-Cyclen and we will repeat  another ultrasound in 6 months.      De Blanch, M.D.  Electronically Signed     DC/MEDQ  D:  09/15/2006  T:  09/16/2006  Job:  [id number]   cc:   Telford Nab, R.N.  [address] [place] [address]  [place], [place] [address]   Wilmon Pali, M.D.   Buford Dresser, M.D.  [place], [place]

## 2012-01-06 DIAGNOSIS — G43909 Migraine, unspecified, not intractable, without status migrainosus: Secondary | ICD-10-CM | POA: Insufficient documentation

## 2012-01-06 DIAGNOSIS — J309 Allergic rhinitis, unspecified: Secondary | ICD-10-CM | POA: Insufficient documentation

## 2012-01-06 DIAGNOSIS — J45909 Unspecified asthma, uncomplicated: Secondary | ICD-10-CM | POA: Insufficient documentation

## 2013-06-10 ENCOUNTER — Telehealth: Payer: Self-pay | Admitting: Gynecologic Oncology

## 2013-06-10 NOTE — Telephone Encounter (Signed)
Patient called with complaints of vaginal bleeding.  She states that she has seen Dr. Stanford Breed in the past for uterine adenocarcinoma.  She had been seeing a physician at [facility] for primary care and follow up.  She states that her birth control pills were switched to another medication due to weight gain.  After, she bled for 1 1/2 months straight.  She reports that she had a uterine biopsy that was normal and an ultrasound that recommended a follow up MRI that she did not want due to cost at this time.  She is to contact her physician's office to have the records faxed to our office.  Appointment made with Dr. Stanford Breed for Friday, July 25.  She states that she had been switched back to her previously prescribed BCP but continues to bleed and have abdominal cramping.

## 2013-06-14 ENCOUNTER — Ambulatory Visit: Payer: 59 | Attending: Gynecology | Admitting: Gynecology

## 2013-06-14 ENCOUNTER — Encounter: Payer: Self-pay | Admitting: Gynecology

## 2013-06-14 VITALS — BP 120/80 | HR 64 | Temp 99.0°F | Resp 16 | Ht 65.51 in | Wt 168.5 lb

## 2013-06-14 DIAGNOSIS — N939 Abnormal uterine and vaginal bleeding, unspecified: Secondary | ICD-10-CM | POA: Insufficient documentation

## 2013-06-14 DIAGNOSIS — N938 Other specified abnormal uterine and vaginal bleeding: Secondary | ICD-10-CM

## 2013-06-14 DIAGNOSIS — C55 Malignant neoplasm of uterus, part unspecified: Secondary | ICD-10-CM

## 2013-06-14 DIAGNOSIS — N926 Irregular menstruation, unspecified: Secondary | ICD-10-CM | POA: Insufficient documentation

## 2013-06-14 DIAGNOSIS — Z8542 Personal history of malignant neoplasm of other parts of uterus: Secondary | ICD-10-CM | POA: Insufficient documentation

## 2013-06-14 DIAGNOSIS — Z79899 Other long term (current) drug therapy: Secondary | ICD-10-CM | POA: Insufficient documentation

## 2013-06-14 NOTE — Patient Instructions (Signed)
Continue taking Nortrel 1/35 to complete this current pack and then start another pack. Please contact us if you're still having bleeding into the third week of her new pack of pills.

## 2013-06-14 NOTE — Progress Notes (Signed)
Consult Note: Gyn-Onc   [name] 32 y.o. female  Chief Complaint  Patient presents with  . Adenosarcoma of uterus    New consult     Assessment : Abnormal uterine bleeding of uncertain origin. The most likely etiology secondary to her change in oral contraceptives. Certainly a recurrence of the adenosarcoma is a possibility although the recent uterine ultrasound and endometrial biopsy are reassuring.  Plan: The patient will continue her current birth control pills through this cycle and into another cycle. If she is continuing to bleed near the end of their next cycle of pills she'll contact me and we will have her back for further evaluation. Should we reach that juncture, consideration would be given to either performing a D&C or hysterectomy.     HPI: 32 year old white female who is a former patient of mine last seen by [place] in 2010. The patient's gynecologic history dates back to 2006 when she presented with heavy bleeding. A D&C showed a meal area and adenosarcoma. The patient wished to preserve fertility and was therefore treated with high-dose progestins and followup D&C in March 2006 showing a minute fragment of adenosarcoma. We continued progestins and a repeat D&C in July 2006 was negative we continue to follow patient with ultrasounds every 6 months. She subsequently was able to get pregnant and now has a 33-year-old son. Over the intervening years the patient was taking Ortho Tri-Cyclen and then more recently Nortrel 1/35. She did well throughout the 4 years she took Nortrel. In April 2014 the patient chose to switch oral contraceptives to Seasonale.  Subsequent to starting Seasonale she began to have irregular bleeding nearly every day. This was evaluated on 05/20/2013 with an endometrial biopsy showing benign suppressed endometrium with increased progesterone effect and an ultrasound showing a uterus that measured 7.1 x 5 cm, an endometrial stripe of 9 mm but with no  visualized uterine abnormalities. Both ovaries appeared normal. Pap smear was also normal.  3 weeks ago the patient switched back to taking Nortrel. Since then she's continued to have bleeding. She also has occasional cramping.  Review of Systems:10 point review of systems is negative except as noted in interval history.   Vitals: Blood pressure 120/80, pulse 64, temperature 99 F (37.2 C), temperature source Oral, resp. rate 16, height 5' 5.51" (1.664 m), weight 168 lb 8 oz (76.431 kg), last menstrual period 06/14/2013.  Physical Exam: General : The patient is a healthy woman in no acute distress.  HEENT: normocephalic, extraoccular movements normal; neck is supple without thyromegally  Lynphnodes: Supraclavicular and inguinal nodes not enlarged  Abdomen: Soft, non-tender, no ascites, no organomegally, no masses, no hernias  Pelvic:  EGBUS: Normal female  Vagina: Normal, no lesions , there is some blood in the vagina Urethra and Bladder: Normal, non-tender  Cervix: Normal with no lesions. Uterus: Anterior normal shape, size, consistency. There is no tenderness. Bi-manual examination: Non-tender; no adenxal masses or nodularity  Rectal: normal sphincter tone, no masses, no blood  Lower extremities: No edema or varicosities. Normal range of motion      No Known Allergies  Past Medical History  Diagnosis Date  . Uterine cancer   . Allergy   . Asthma     History reviewed. No pertinent past surgical history.  Current Outpatient Prescriptions  Medication Sig Dispense Refill  . cetirizine (ZYRTEC) 10 MG tablet Take 10 mg by mouth daily.      . fluticasone (FLOVENT HFA) 110 MCG/ACT inhaler Inhale 1 puff  into the lungs as needed.      . norethindrone-ethinyl estradiol 1/35 (ORTHO-NOVUM, NORTREL,CYCLAFEM) tablet Take 1 tablet by mouth daily.       No current facility-administered medications for this visit.    History   Social History  . Marital Status: Married    Spouse  Name: N/A    Number of Children: N/A  . Years of Education: N/A   Occupational History  . Not on file.   Social History Main Topics  . Smoking status: Never Smoker   . Smokeless tobacco: Not on file  . Alcohol Use: 1.2 oz/week    1 Glasses of wine, 1 Shots of liquor per week  . Drug Use: Not on file  . Sexually Active: Yes    Birth Control/ Protection: Pill   Other Topics Concern  . Not on file   Social History Narrative  . No narrative on file    History reviewed. No pertinent family history.    Jeannette Corpus, MD 06/14/2013, 4:23 PM

## 2013-06-19 ENCOUNTER — Telehealth: Payer: Self-pay | Admitting: Gynecologic Oncology

## 2013-06-19 ENCOUNTER — Encounter: Payer: Self-pay | Admitting: Gynecology

## 2013-06-19 DIAGNOSIS — R109 Unspecified abdominal pain: Secondary | ICD-10-CM

## 2013-06-19 MED ORDER — TRAMADOL HCL 50 MG PO TABS: 50.0000 mg | ORAL_TABLET | Freq: Four times a day (QID) | ORAL | Status: DC | PRN

## 2013-06-19 NOTE — Telephone Encounter (Signed)
Patient called with complaints of moderate abdominal cramping.  She states that she would like something stronger than ibuprofen for pain since she is unable to sleep.  No known allergies reported.  Informed that Tramadol would be sent to [facility] in [facility] with instructions to take one tablet every six hours as needed for moderate pain.  Advised to call for any questions or concerns.  She is to call the office if the symptoms worsen or persist.

## 2013-07-24 DIAGNOSIS — Z87898 Personal history of other specified conditions: Secondary | ICD-10-CM | POA: Insufficient documentation

## 2013-09-26 ENCOUNTER — Other Ambulatory Visit: Payer: Self-pay

## 2014-01-01 ENCOUNTER — Telehealth: Payer: Self-pay | Admitting: *Deleted

## 2014-01-01 NOTE — Telephone Encounter (Signed)
Pt called with concerns states " I've been having some spotting, cramping and pain for about 3 weeks." Pt denies pain with intercourse, spotting is brown, cramping pain is intermittent. Pt due for PAP in April, Next avail appt with Dr. Judeth Porch March 3, recommended pt could call and make an appt with [facility] to be seen sooner or call her GYN to be further evaluated. Pt verbalized understanding, advised she did not have a way to write down number and will call back in am for [facility] #. In the mean time she will call her GYN.

## 2014-01-20 ENCOUNTER — Telehealth: Payer: Self-pay | Admitting: *Deleted

## 2014-01-20 NOTE — Telephone Encounter (Signed)
Call from Dr. C-P, advised pt will be seen tomorrow at [place] pt confirmed appt date/time. Pt verbalized understanding

## 2014-01-21 ENCOUNTER — Encounter: Payer: Self-pay | Admitting: Gynecology

## 2014-01-21 ENCOUNTER — Other Ambulatory Visit (HOSPITAL_COMMUNITY)
Admission: RE | Admit: 2014-01-21 | Discharge: 2014-01-21 | Disposition: A | Discharge status: Home / Self Care | Payer: 59 | Source: Ambulatory Visit | Attending: Gynecology | Admitting: Gynecology

## 2014-01-21 ENCOUNTER — Ambulatory Visit: Payer: 59 | Attending: Gynecology | Admitting: Gynecology

## 2014-01-21 DIAGNOSIS — N938 Other specified abnormal uterine and vaginal bleeding: Secondary | ICD-10-CM

## 2014-01-21 DIAGNOSIS — N898 Other specified noninflammatory disorders of vagina: Secondary | ICD-10-CM | POA: Insufficient documentation

## 2014-01-21 DIAGNOSIS — Z79899 Other long term (current) drug therapy: Secondary | ICD-10-CM | POA: Insufficient documentation

## 2014-01-21 DIAGNOSIS — J45909 Unspecified asthma, uncomplicated: Secondary | ICD-10-CM | POA: Insufficient documentation

## 2014-01-21 DIAGNOSIS — Z01419 Encounter for gynecological examination (general) (routine) without abnormal findings: Secondary | ICD-10-CM | POA: Insufficient documentation

## 2014-01-21 DIAGNOSIS — C55 Malignant neoplasm of uterus, part unspecified: Secondary | ICD-10-CM | POA: Insufficient documentation

## 2014-01-21 NOTE — Progress Notes (Signed)
Consult Note: Gyn-Onc   [name] 33 y.o. female  Chief Complaint  Patient presents with  . Adnenosarcoma of uterus    Assessment : Abnormal uterine bleeding of uncertain origin. Past history of adenosarcoma of the uterus. Plan: A Pap smear is obtained. Am unable to identify the cervix and therefore unable to perform an endometrial biopsy. Patient had considerable amount discomfort with the speculum exam.  Pap smears obtained  We'll schedule an ultrasound of the pelvis to further delineate anatomy of the uterus tubes and ovaries and cervix. We will likely need to perform a D&C. The patient is inclined to proceed with hysterectomy but if indicated we will not schedule any hysterectomy 2 with complete a D&C and this workup.  Interval history. Patient returns today complaining of one month of continuous vaginal bleeding. Actually her problems started back in December when she had some bleeding which then became a brown discharge. Proximal to go she developed some acute right lower quadrant pain and the bleeding became increased. She is also passed some "tissue". Today she still bleeding but not having any significant pelvic pain.  Through the fall of 2014 the patient had regular cyclic menses take her birth control pills. She continued take birth control pills without any interruption since that time including currently while she's been having heavy bleeding.    HPI: 33-year-old white female who is a former patient of mine last seen by us in 2014. The patient's gynecologic history dates back to 2006 when she presented with heavy bleeding. A D&C showed an adenosarcoma. The patient wished to preserve fertility and was therefore treated with high-dose progestins and followup D&C in March 2006 showing a minute fragment of adenosarcoma. We continued progestins and a repeat D&C in July 2006 was negative we continue to follow patient with ultrasounds every 6 months. She subsequently was able to  get pregnant and now has a 5-year-old son. Over the intervening years the patient was taking Ortho Tri-Cyclen and then more recently Nortrel 1/35. She did well throughout the 4 years she took Nortrel. In April 2014 the patient chose to switch oral contraceptives to Seasonale.  Subsequent to starting Seasonale she began to have irregular bleeding nearly every day. This was evaluated on 05/20/2013 with an endometrial biopsy showing benign suppressed endometrium with increased progesterone effect and an ultrasound showing a uterus that measured 7.1 x 5 cm, an endometrial stripe of 9 mm but with no visualized uterine abnormalities. Both ovaries appeared normal. Pap smear was also normal.    Review of Systems:10 point review of systems is negative except as noted in interval history.   Vitals: There were no vitals taken for this visit.  Physical Exam: General : The patient is a healthy woman in no acute distress.  HEENT: normocephalic, extraoccular movements normal; neck is supple without thyromegally  Lynphnodes: Supraclavicular and inguinal nodes not enlarged  Abdomen: Soft, non-tender, no ascites, no organomegally, no masses, no hernias  Pelvic:  EGBUS: Normal female  Vagina: Normal, no lesions , there is some blood in the vagina, the patient is not tolerate a speculum exam very well. Urethra and Bladder: Normal, non-tender  Cervix: Unable to visualize. Uterus: Difficult to palpate There is no tenderness. Bi-manual examination: Non-tender; no adenxal masses or nodularity  Rectal: normal sphincter tone, no masses, no blood  Lower extremities: No edema or varicosities. Normal range of motion      No Known Allergies  Past Medical History  Diagnosis Date  . Uterine   cancer   . Allergy   . Asthma     No past surgical history on file.  Current Outpatient Prescriptions  Medication Sig Dispense Refill  . cetirizine (ZYRTEC) 10 MG tablet Take 10 mg by mouth daily.      . fluticasone  (FLOVENT HFA) 110 MCG/ACT inhaler Inhale 1 puff into the lungs as needed.      . Multiple Vitamin (MULTIVITAMIN) tablet Take 1 tablet by mouth daily.      . norethindrone-ethinyl estradiol 1/35 (ORTHO-NOVUM, NORTREL,CYCLAFEM) tablet Take 1 tablet by mouth daily.       No current facility-administered medications for this visit.    History   Social History  . Marital Status: Married    Spouse Name: N/A    Number of Children: N/A  . Years of Education: N/A   Occupational History  . Not on file.   Social History Main Topics  . Smoking status: Never Smoker   . Smokeless tobacco: Not on file  . Alcohol Use: 1.2 oz/week    1 Glasses of wine, 1 Shots of liquor per week  . Drug Use: Not on file  . Sexual Activity: Yes    Birth Control/ Protection: Pill   Other Topics Concern  . Not on file   Social History Narrative  . No narrative on file    No family history on file.    Alvino Chapel, MD 01/21/2014, 2:44 PM

## 2014-01-21 NOTE — Patient Instructions (Signed)
We will contact you with the results of your ultrasound and pap smear results.  Please call for any questions or concerns.

## 2014-01-24 ENCOUNTER — Telehealth: Payer: Self-pay | Admitting: Gynecologic Oncology

## 2014-01-24 ENCOUNTER — Ambulatory Visit (HOSPITAL_COMMUNITY)
Admission: RE | Admit: 2014-01-24 | Discharge: 2014-01-24 | Disposition: A | Discharge status: Home / Self Care | Payer: 59 | Source: Ambulatory Visit | Attending: Gynecologic Oncology | Admitting: Gynecologic Oncology

## 2014-01-24 DIAGNOSIS — C55 Malignant neoplasm of uterus, part unspecified: Secondary | ICD-10-CM

## 2014-01-24 DIAGNOSIS — N898 Other specified noninflammatory disorders of vagina: Secondary | ICD-10-CM | POA: Insufficient documentation

## 2014-01-24 NOTE — Telephone Encounter (Signed)
Advised of Dr. Mora Bellman recommendations for a outpatient D&C.  Verbalizing understanding.  Advised that she would be contacted with further instructions and that most likely the procedure would take place at [facility] on March 13.  Instructed to call for any questions or concerns.

## 2014-01-24 NOTE — Telephone Encounter (Signed)
Informed of ultrasound results and advised that she would be contacted with Dr. Mora Bellman recommendations when available.  No concerns voiced.  Advised to call for any needs or concerns.

## 2014-01-27 ENCOUNTER — Telehealth: Payer: Self-pay | Admitting: *Deleted

## 2014-01-27 NOTE — Telephone Encounter (Signed)
Pt called states " my mom is Janine Ores and is a Immunologist at [employer] and I do not want her to be in the OR on the day of my surgery." Call to [facility] spoke with Jocelyn Lamer who advised no one by that name comes to [facility]."

## 2014-01-27 NOTE — Telephone Encounter (Signed)
Message copied by Lucile Crater on Mon Jan 27, 2014  8:19 AM ------      Message from: Joylene John D      Created: Fri Jan 24, 2014  4:55 PM       Please let the patient know that her pap smear was normal and that she is scheduled for [facility] on March 13 in the am starting at 7:30 am.             ----- Message -----         From: [facility] Interface         Sent: 01/24/2014   4:46 PM           To: Dorothyann Gibbs, NP                   ------

## 2014-01-27 NOTE — Telephone Encounter (Signed)
Notified pt pap smear normal, D& C scheduled for March 13 at 0730. Pt to expect a call from pre-op.  Went over pt to have clear liquids x24hrs day prior, NPO after midnight on 3/12. Pt verbalized understanding.

## 2014-01-28 ENCOUNTER — Encounter (HOSPITAL_BASED_OUTPATIENT_CLINIC_OR_DEPARTMENT_OTHER): Payer: Self-pay | Admitting: *Deleted

## 2014-01-29 ENCOUNTER — Encounter (HOSPITAL_BASED_OUTPATIENT_CLINIC_OR_DEPARTMENT_OTHER): Payer: Self-pay | Admitting: *Deleted

## 2014-01-29 NOTE — Progress Notes (Signed)
NPO AFTER MN. ARRIVE AT 0600. NEEDS HG AND URINE PREG. WILL TAKE ZYRTEC AM DOS W/ I SIPS OF WATER.

## 2014-01-31 ENCOUNTER — Encounter (HOSPITAL_BASED_OUTPATIENT_CLINIC_OR_DEPARTMENT_OTHER)
Admission: RE | Disposition: A | Discharge status: Home / Self Care | Payer: Self-pay | Source: Ambulatory Visit | Attending: Gynecology

## 2014-01-31 ENCOUNTER — Ambulatory Visit (HOSPITAL_BASED_OUTPATIENT_CLINIC_OR_DEPARTMENT_OTHER): Payer: 59 | Admitting: Anesthesiology

## 2014-01-31 ENCOUNTER — Encounter (HOSPITAL_BASED_OUTPATIENT_CLINIC_OR_DEPARTMENT_OTHER): Payer: 59 | Admitting: Anesthesiology

## 2014-01-31 ENCOUNTER — Ambulatory Visit (HOSPITAL_BASED_OUTPATIENT_CLINIC_OR_DEPARTMENT_OTHER)
Admission: RE | Admit: 2014-01-31 | Discharge: 2014-01-31 | Disposition: A | Discharge status: Home / Self Care | Payer: 59 | Source: Ambulatory Visit | Attending: Gynecology | Admitting: Gynecology

## 2014-01-31 ENCOUNTER — Encounter (HOSPITAL_BASED_OUTPATIENT_CLINIC_OR_DEPARTMENT_OTHER): Payer: Self-pay

## 2014-01-31 DIAGNOSIS — N938 Other specified abnormal uterine and vaginal bleeding: Secondary | ICD-10-CM

## 2014-01-31 DIAGNOSIS — J45909 Unspecified asthma, uncomplicated: Secondary | ICD-10-CM | POA: Insufficient documentation

## 2014-01-31 DIAGNOSIS — Z8542 Personal history of malignant neoplasm of other parts of uterus: Secondary | ICD-10-CM | POA: Insufficient documentation

## 2014-01-31 DIAGNOSIS — Z79899 Other long term (current) drug therapy: Secondary | ICD-10-CM | POA: Insufficient documentation

## 2014-01-31 DIAGNOSIS — N949 Unspecified condition associated with female genital organs and menstrual cycle: Secondary | ICD-10-CM | POA: Insufficient documentation

## 2014-01-31 HISTORY — DX: Other specified postprocedural states: Z98.890

## 2014-01-31 HISTORY — DX: Nausea with vomiting, unspecified: R11.2

## 2014-01-31 HISTORY — DX: Malignant neoplasm of uterus, part unspecified: C55

## 2014-01-31 HISTORY — PX: DILATION AND CURETTAGE OF UTERUS: SHX78

## 2014-01-31 HISTORY — DX: Other specified abnormal uterine and vaginal bleeding: N93.8

## 2014-01-31 LAB — POCT HEMOGLOBIN-HEMACUE: Hemoglobin: 13.2 g/dL (ref 12.0–15.0)

## 2014-01-31 LAB — POCT PREGNANCY, URINE: Preg Test, Ur: NEGATIVE

## 2014-01-31 SURGERY — DILATION AND CURETTAGE
Anesthesia: General | Site: Uterus

## 2014-01-31 MED ORDER — LACTATED RINGERS IV SOLN
INTRAVENOUS | Status: DC
  Filled 2014-01-31: qty 1000

## 2014-01-31 MED ORDER — MEPERIDINE HCL 25 MG/ML IJ SOLN
6.2500 mg | INTRAMUSCULAR | Status: DC | PRN
  Filled 2014-01-31: qty 1

## 2014-01-31 MED ORDER — ACETAMINOPHEN 650 MG RE SUPP
650.0000 mg | RECTAL | Status: DC | PRN
  Filled 2014-01-31: qty 1

## 2014-01-31 MED ORDER — FENTANYL CITRATE 0.05 MG/ML IJ SOLN
INTRAMUSCULAR | Status: DC | PRN
  Administered 2014-01-31 (×4): 50 ug via INTRAVENOUS

## 2014-01-31 MED ORDER — ACETAMINOPHEN 325 MG PO TABS
650.0000 mg | ORAL_TABLET | ORAL | Status: DC | PRN
  Filled 2014-01-31: qty 2

## 2014-01-31 MED ORDER — ACETAMINOPHEN 10 MG/ML IV SOLN
1000.0000 mg | Freq: Once | INTRAVENOUS | Status: AC
  Administered 2014-01-31: 1000 mg via INTRAVENOUS
  Filled 2014-01-31: qty 100

## 2014-01-31 MED ORDER — PROPOFOL 10 MG/ML IV BOLUS
INTRAVENOUS | Status: DC | PRN
  Administered 2014-01-31 (×2): 20 mg via INTRAVENOUS
  Administered 2014-01-31: 200 mg via INTRAVENOUS

## 2014-01-31 MED ORDER — PROMETHAZINE HCL 25 MG/ML IJ SOLN
6.2500 mg | INTRAMUSCULAR | Status: DC | PRN
  Filled 2014-01-31: qty 1

## 2014-01-31 MED ORDER — FENTANYL CITRATE 0.05 MG/ML IJ SOLN
25.0000 ug | INTRAMUSCULAR | Status: DC | PRN
  Filled 2014-01-31: qty 1

## 2014-01-31 MED ORDER — KETOROLAC TROMETHAMINE 30 MG/ML IJ SOLN
INTRAMUSCULAR | Status: DC | PRN
  Administered 2014-01-31: 30 mg via INTRAVENOUS

## 2014-01-31 MED ORDER — MIDAZOLAM HCL 2 MG/2ML IJ SOLN
INTRAMUSCULAR | Status: AC
  Filled 2014-01-31: qty 2

## 2014-01-31 MED ORDER — MIDAZOLAM HCL 5 MG/5ML IJ SOLN
INTRAMUSCULAR | Status: DC | PRN
Start: 2014-01-31 — End: 2014-01-31
  Administered 2014-01-31: 2 mg via INTRAVENOUS

## 2014-01-31 MED ORDER — LIDOCAINE HCL (CARDIAC) 20 MG/ML IV SOLN
INTRAVENOUS | Status: DC | PRN
  Administered 2014-01-31: 100 mg via INTRAVENOUS

## 2014-01-31 MED ORDER — SCOPOLAMINE 1 MG/3DAYS TD PT72
MEDICATED_PATCH | TRANSDERMAL | Status: DC | PRN
  Administered 2014-01-31: 1 via TRANSDERMAL

## 2014-01-31 MED ORDER — ONDANSETRON HCL 4 MG/2ML IJ SOLN
4.0000 mg | Freq: Four times a day (QID) | INTRAMUSCULAR | Status: DC | PRN
  Filled 2014-01-31: qty 2

## 2014-01-31 MED ORDER — ONDANSETRON HCL 4 MG/2ML IJ SOLN
INTRAMUSCULAR | Status: DC | PRN
  Administered 2014-01-31: 4 mg via INTRAVENOUS

## 2014-01-31 MED ORDER — FENTANYL CITRATE 0.05 MG/ML IJ SOLN
INTRAMUSCULAR | Status: AC
  Filled 2014-01-31: qty 4

## 2014-01-31 MED ORDER — STERILE WATER FOR IRRIGATION IR SOLN
Status: DC | PRN
  Administered 2014-01-31: 500 mL

## 2014-01-31 MED ORDER — DEXAMETHASONE SODIUM PHOSPHATE 4 MG/ML IJ SOLN
INTRAMUSCULAR | Status: DC | PRN
  Administered 2014-01-31: 10 mg via INTRAVENOUS

## 2014-01-31 MED ORDER — LACTATED RINGERS IV SOLN
INTRAVENOUS | Status: DC
  Administered 2014-01-31 (×2): via INTRAVENOUS
  Filled 2014-01-31: qty 1000

## 2014-01-31 MED ORDER — ACETAMINOPHEN 500 MG PO TABS
1000.0000 mg | ORAL_TABLET | Freq: Four times a day (QID) | ORAL | Status: DC
Start: 2014-01-31 — End: 2014-01-31
  Filled 2014-01-31: qty 2

## 2014-01-31 SURGICAL SUPPLY — 23 items
CLOTH BEACON ORANGE TIMEOUT ST (SAFETY) ×3 IMPLANT
COVER TABLE BACK 60X90 (DRAPES) ×3 IMPLANT
DRAPE LG THREE QUARTER DISP (DRAPES) ×3 IMPLANT
DRAPE UNDERBUTTOCKS STRL (DRAPE) ×3 IMPLANT
DRESSING TELFA 8X3 (GAUZE/BANDAGES/DRESSINGS) ×3 IMPLANT
GLOVE BIO SURGEON STRL SZ 6.5 (GLOVE) ×1 IMPLANT
GLOVE BIO SURGEON STRL SZ7.5 (GLOVE) ×6 IMPLANT
GLOVE BIO SURGEONS STRL SZ 6.5 (GLOVE) ×1
GLOVE INDICATOR 6.5 STRL GRN (GLOVE) ×2 IMPLANT
GOWN PREVENTION PLUS LG XLONG (DISPOSABLE) ×1 IMPLANT
GOWN STRL REIN XL XLG (GOWN DISPOSABLE) ×3 IMPLANT
GOWN STRL REUS W/ TWL LRG LVL3 (GOWN DISPOSABLE) IMPLANT
GOWN STRL REUS W/ TWL XL LVL3 (GOWN DISPOSABLE) IMPLANT
GOWN STRL REUS W/TWL LRG LVL3 (GOWN DISPOSABLE) ×3
GOWN STRL REUS W/TWL XL LVL3 (GOWN DISPOSABLE) ×3
LEGGING LITHOTOMY PAIR STRL (DRAPES) ×3 IMPLANT
NDL SPNL 22GX3.5 QUINCKE BK (NEEDLE) IMPLANT
NEEDLE SPNL 22GX3.5 QUINCKE BK (NEEDLE) IMPLANT
PAD OB MATERNITY 4.3X12.25 (PERSONAL CARE ITEMS) ×3 IMPLANT
SYR CONTROL 10ML LL (SYRINGE) IMPLANT
TOWEL OR 17X24 6PK STRL BLUE (TOWEL DISPOSABLE) ×3 IMPLANT
UNDERPAD 30X30 INCONTINENT (UNDERPADS AND DIAPERS) ×3 IMPLANT
WATER STERILE IRR 500ML POUR (IV SOLUTION) ×3 IMPLANT

## 2014-01-31 NOTE — H&P (View-Only) (Signed)
Consult Note: Gyn-Onc   [name] 33 y.o. female  Chief Complaint  Patient presents with  . Adnenosarcoma of uterus    Assessment : Abnormal uterine bleeding of uncertain origin. Past history of adenosarcoma of the uterus. Plan: A Pap smear is obtained. Am unable to identify the cervix and therefore unable to perform an endometrial biopsy. Patient had considerable amount discomfort with the speculum exam.  Pap smears obtained  We'll schedule an ultrasound of the pelvis to further delineate anatomy of the uterus tubes and ovaries and cervix. We will likely need to perform a D&C. The patient is inclined to proceed with hysterectomy but if indicated we will not schedule any hysterectomy 2 with complete a D&C and this workup.  Interval history. Patient returns today complaining of one month of continuous vaginal bleeding. Actually her problems started back in December when she had some bleeding which then became a brown discharge. Proximal to go she developed some acute right lower quadrant pain and the bleeding became increased. She is also passed some "tissue". Today she still bleeding but not having any significant pelvic pain.  Through the fall of 2014 the patient had regular cyclic menses take her birth control pills. She continued take birth control pills without any interruption since that time including currently while she's been having heavy bleeding.    HPI: [age]-year-old white female who is a former patient of mine last seen by [place] in 2014. The patient's gynecologic history dates back to 2006 when she presented with heavy bleeding. A D&C showed an adenosarcoma. The patient wished to preserve fertility and was therefore treated with high-dose progestins and followup D&C in March 2006 showing a minute fragment of adenosarcoma. We continued progestins and a repeat D&C in July 2006 was negative we continue to follow patient with ultrasounds every 6 months. She subsequently was able to  get pregnant and now has a 74-year-old son. Over the intervening years the patient was taking Ortho Tri-Cyclen and then more recently Nortrel 1/35. She did well throughout the 4 years she took Nortrel. In April 2014 the patient chose to switch oral contraceptives to Seasonale.  Subsequent to starting Seasonale she began to have irregular bleeding nearly every day. This was evaluated on 05/20/2013 with an endometrial biopsy showing benign suppressed endometrium with increased progesterone effect and an ultrasound showing a uterus that measured 7.1 x 5 cm, an endometrial stripe of 9 mm but with no visualized uterine abnormalities. Both ovaries appeared normal. Pap smear was also normal.    Review of Systems:10 point review of systems is negative except as noted in interval history.   Vitals: There were no vitals taken for this visit.  Physical Exam: General : The patient is a healthy woman in no acute distress.  HEENT: normocephalic, extraoccular movements normal; neck is supple without thyromegally  Lynphnodes: Supraclavicular and inguinal nodes not enlarged  Abdomen: Soft, non-tender, no ascites, no organomegally, no masses, no hernias  Pelvic:  EGBUS: Normal female  Vagina: Normal, no lesions , there is some blood in the vagina, the patient is not tolerate a speculum exam very well. Urethra and Bladder: Normal, non-tender  Cervix: Unable to visualize. Uterus: Difficult to palpate There is no tenderness. Bi-manual examination: Non-tender; no adenxal masses or nodularity  Rectal: normal sphincter tone, no masses, no blood  Lower extremities: No edema or varicosities. Normal range of motion      No Known Allergies  Past Medical History  Diagnosis Date  . Uterine  cancer   . Allergy   . Asthma     No past surgical history on file.  Current Outpatient Prescriptions  Medication Sig Dispense Refill  . cetirizine (ZYRTEC) 10 MG tablet Take 10 mg by mouth daily.      . fluticasone  (FLOVENT HFA) 110 MCG/ACT inhaler Inhale 1 puff into the lungs as needed.      . Multiple Vitamin (MULTIVITAMIN) tablet Take 1 tablet by mouth daily.      . norethindrone-ethinyl estradiol 1/35 (ORTHO-NOVUM, NORTREL,CYCLAFEM) tablet Take 1 tablet by mouth daily.       No current facility-administered medications for this visit.    History   Social History  . Marital Status: Married    Spouse Name: N/A    Number of Children: N/A  . Years of Education: N/A   Occupational History  . Not on file.   Social History Main Topics  . Smoking status: Never Smoker   . Smokeless tobacco: Not on file  . Alcohol Use: 1.2 oz/week    1 Glasses of wine, 1 Shots of liquor per week  . Drug Use: Not on file  . Sexual Activity: Yes    Birth Control/ Protection: Pill   Other Topics Concern  . Not on file   Social History Narrative  . No narrative on file    No family history on file.    Alvino Chapel, MD 01/21/2014, 2:44 PM

## 2014-01-31 NOTE — Op Note (Signed)
KATRICIA PREHN  female MEDICAL RECORD WI:[id number] DATE OF BIRTH: [birth year] PHYSICIAN: Marti Sleigh, M.D  01/31/2014   OPERATIVE REPORT  PREOPERATIVE DIAGNOSIS:Abnormal uterine bleeding.  History of adenosarcoma of the uterus  POSTOPERATIVE DIAGNOSIS:same  PROCEDURE: EUA, Fractional&C  SURGEON: Marti Sleigh, M.D ASSISTANT:  ANESTHESIA A: LMA ESTIMATED BLOOD LOSS:minimal  SURGICAL FINDINGS: Normal size uterus.  Sounds to 7 cm anterior.  Normal cervix.  No adnexal masses.    PROCEDURE:  The patient was brought to the operating room.  After LMA was achieved, the vulva and vagina were prepped and draped.  A time out was taken.  A weighted speculum was inserted.  The cervix was grasped with a tenaculum and sounded to 7 cm.  The cervix was dilated to a 23 Pratt dilator.  ECC was obtained.  Endometrial curettage was undertaken revealing copious mucus but little tissue.  Stone forceps were also passed into the uterus.  No polyps were found. The instruments were removed and the patient taken to the recovery room in stable condition.   Sponge, instrument and needle count correct.   Marti Sleigh, M.D

## 2014-01-31 NOTE — Anesthesia Postprocedure Evaluation (Signed)
  Anesthesia Post-op Note  Patient: [name]  Procedure(s) Performed: Procedure(s) (LRB): DILATATION AND CURETTAGE (N/A)  Patient Location: PACU  Anesthesia Type: General  Level of Consciousness: awake and alert   Airway and Oxygen Therapy: Patient Spontanous Breathing  Post-op Pain: mild  Post-op Assessment: Post-op Vital signs reviewed, Patient's Cardiovascular Status Stable, Respiratory Function Stable, Patent Airway and No signs of Nausea or vomiting  Last Vitals:  Filed Vitals:   01/31/14 0957  BP: 103/69  Pulse: 86  Temp: 36.1 C  Resp: 16    Post-op Vital Signs: stable   Complications: No apparent anesthesia complications

## 2014-01-31 NOTE — Anesthesia Procedure Notes (Signed)
Procedure Name: LMA Insertion Date/Time: 01/31/2014 7:30 AM Performed by: Mechele Claude Pre-anesthesia Checklist: Patient identified, Emergency Drugs available, Suction available and Patient being monitored Patient Re-evaluated:Patient Re-evaluated prior to inductionOxygen Delivery Method: Circle System Utilized Preoxygenation: Pre-oxygenation with 100% oxygen Intubation Type: IV induction Ventilation: Mask ventilation without difficulty LMA: LMA inserted LMA Size: 4.0 Number of attempts: 1 Airway Equipment and Method: bite block Placement Confirmation: positive ETCO2 Tube secured with: Tape Dental Injury: Teeth and Oropharynx as per pre-operative assessment

## 2014-01-31 NOTE — Transfer of Care (Signed)
Immediate Anesthesia Transfer of Care Note  Patient: [name]  Procedure(s) Performed: Procedure(s) (LRB): DILATATION AND CURETTAGE (N/A)  Patient Location: PACU  Anesthesia Type: General  Level of Consciousness: awake, alert  and oriented  Airway & Oxygen Therapy: Patient Spontanous Breathing and Patient connected to nasal cannula oxygen  Post-op Assessment: Report given to PACU RN and Post -op Vital signs reviewed and stable  Post vital signs: Reviewed and stable  Complications: No apparent anesthesia complications

## 2014-01-31 NOTE — Anesthesia Preprocedure Evaluation (Addendum)
Anesthesia Evaluation  Patient identified by MRN, date of birth, ID band Patient awake    Reviewed: Allergy & Precautions, H&P , NPO status , Patient's Chart, lab work & pertinent test results  History of Anesthesia Complications (+) PONV  Airway Mallampati: II TM Distance: >3 FB Neck ROM: Full    Dental no notable dental hx.    Pulmonary asthma ,  breath sounds clear to auscultation  Pulmonary exam normal       Cardiovascular negative cardio ROS  Rhythm:Regular Rate:Normal     Neuro/Psych negative neurological ROS  negative psych ROS   GI/Hepatic negative GI ROS, Neg liver ROS,   Endo/Other  negative endocrine ROS  Renal/GU negative Renal ROS  negative genitourinary   Musculoskeletal negative musculoskeletal ROS (+)   Abdominal   Peds negative pediatric ROS (+)  Hematology negative hematology ROS (+)   Anesthesia Other Findings Lower permanent retainer  Reproductive/Obstetrics negative OB ROS                          Anesthesia Physical Anesthesia Plan  ASA: II  Anesthesia Plan: General   Post-op Pain Management:    Induction: Intravenous  Airway Management Planned: LMA  Additional Equipment:   Intra-op Plan:   Post-operative Plan:   Informed Consent: I have reviewed the patients History and Physical, chart, labs and discussed the procedure including the risks, benefits and alternatives for the proposed anesthesia with the patient or authorized representative who has indicated his/her understanding and acceptance.   Dental advisory given  Plan Discussed with: CRNA  Anesthesia Plan Comments:         Anesthesia Quick Evaluation

## 2014-01-31 NOTE — Discharge Instructions (Signed)
Abnormal Uterine Bleeding Abnormal uterine bleeding can affect women at various stages in life, including teenagers, women in their reproductive years, pregnant women, and women who have reached menopause. Several kinds of uterine bleeding are considered abnormal, including:  Bleeding or spotting between periods.   Bleeding after sexual intercourse.   Bleeding that is heavier or more than normal.   Periods that last longer than usual.  Bleeding after menopause.  Many cases of abnormal uterine bleeding are minor and simple to treat, while others are more serious. Any type of abnormal bleeding should be evaluated by your health care provider. Treatment will depend on the cause of the bleeding. HOME CARE INSTRUCTIONS Monitor your condition for any changes. The following actions may help to alleviate any discomfort you are experiencing:  Avoid the use of tampons and douches as directed by your health care provider.  Change your pads frequently. You should get regular pelvic exams and Pap tests. Keep all follow-up appointments for diagnostic tests as directed by your health care provider.  SEEK MEDICAL CARE IF:   Your bleeding lasts more than 1 week.   You feel dizzy at times.  SEEK IMMEDIATE MEDICAL CARE IF:   You pass out.   You are changing pads every 15 to 30 minutes.   You have abdominal pain.  You have a fever.   You become sweaty or weak.   You are passing large blood clots from the vagina.   You start to feel nauseous and vomit. MAKE SURE YOU:   Understand these instructions.  Will watch your condition.  Will get help right away if you are not doing well or get worse. Document Released: 11/07/2005 Document Revised: 07/10/2013 Document Reviewed: 06/06/2013 [facility] Patient Information 2014 [place], [place].  Post Anesthesia Home Care Instructions  Activity: Get plenty of rest for the remainder of the day. A responsible adult should stay with you for 24  hours following the procedure.  For the next 24 hours, DO NOT: -Drive a car -Paediatric nurse -Drink alcoholic beverages -Take any medication unless instructed by your physician -Make any legal decisions or sign important papers.  Meals: Start with liquid foods such as gelatin or soup. Progress to regular foods as tolerated. Avoid greasy, spicy, heavy foods. If nausea and/or vomiting occur, drink only clear liquids until the nausea and/or vomiting subsides. Call your physician if vomiting continues.  Special Instructions/Symptoms: Your throat may feel dry or sore from the anesthesia or the breathing tube placed in your throat during surgery. If this causes discomfort, gargle with warm salt water. The discomfort should disappear within 24 hours.

## 2014-01-31 NOTE — Interval H&P Note (Signed)
History and Physical Interval Note:  01/31/2014 7:15 AM  [name] Mode  has presented today for surgery, with the diagnosis of uterine bleeding  Adenocarcinoma of uterus  The various methods of treatment have been discussed with the patient and family. After consideration of risks, benefits and other options for treatment, the patient has consented to  Procedure(s): DILATATION AND CURETTAGE (N/A) as a surgical intervention .  The patient's history has been reviewed, patient examined, no change in status, stable for surgery.  I have reviewed the patient's chart and labs.  Questions were answered to the patient's satisfaction.     CLARKE-PEARSON,Boyce Keltner L

## 2014-02-03 ENCOUNTER — Telehealth: Payer: Self-pay | Admitting: Gynecologic Oncology

## 2014-02-03 ENCOUNTER — Encounter (HOSPITAL_BASED_OUTPATIENT_CLINIC_OR_DEPARTMENT_OTHER): Payer: Self-pay | Admitting: Gynecology

## 2014-02-03 NOTE — Telephone Encounter (Signed)
Patient informed of final pathology results.  No concerns voiced.  Reporting intermittent abdominal cramping that began yesterday evening with light pink spotting.  Reportable signs and symptoms reviewed.  Advised to call for any questions or concerns.

## 2014-02-10 ENCOUNTER — Encounter: Payer: Self-pay | Admitting: Gynecology

## 2014-02-10 ENCOUNTER — Ambulatory Visit: Payer: 59 | Attending: Gynecology | Admitting: Gynecology

## 2014-02-10 VITALS — BP 111/73 | HR 94 | Temp 98.4°F | Wt 175.3 lb

## 2014-02-10 DIAGNOSIS — Z79899 Other long term (current) drug therapy: Secondary | ICD-10-CM | POA: Insufficient documentation

## 2014-02-10 DIAGNOSIS — C55 Malignant neoplasm of uterus, part unspecified: Secondary | ICD-10-CM | POA: Insufficient documentation

## 2014-02-10 NOTE — Patient Instructions (Signed)
Please call if you would like to proceed with a vaginal hysterectomy in the near future.  Plan to follow up with Dr. Fermin Schwab in six months or sooner if needed.  Please call in May to schedule your appointment for Sept 2015.

## 2014-02-10 NOTE — Progress Notes (Signed)
Consult Note: Gyn-Onc   [name] 33 y.o. female  Chief Complaint  Patient presents with  . Adenosarcoma of uterus    Assessment : Abnormal uterine bleeding of uncertain origin now resolved following D&C. Past history of adenosarcoma of the uterus. Plan the patient given the okay to return to full levels of activity.  We discussed the pros and cons of hysterectomy I believe that the surgery could be accomplished vaginally and I emphasized that removing the tubes and ovaries was not necessary the patient will contact us if she decides to go ahead with surgery. Otherwise she return to see me in 6 months.  She will continue taking birth control pills.  Interval history. Patient returns today  for initial postoperative followup. She underwent a fractional D&C on March 13 to evaluate abnormal uterine bleeding. Final pathology showed no abnormalities. The endometrium was secretory. Patient's had an uncomplicated postoperative course is return to full levels of activity. She has been considering hysterectomy.  HPI: [age]-year-old white female who is a former patient of mine last seen by [place] in 2014. The patient's gynecologic history dates back to 2006 when she presented with heavy bleeding. A D&C showed an adenosarcoma. The patient wished to preserve fertility and was therefore treated with high-dose progestins and followup D&C in March 2006 showing a minute fragment of adenosarcoma. We continued progestins and a repeat D&C in July 2006 was negative we continue to follow patient with ultrasounds every 6 months. She subsequently was able to get pregnant and now has a 79-year-old son. Over the intervening years the patient was taking Ortho Tri-Cyclen and then more recently Nortrel 1/35. She did well throughout the 4 years she took Nortrel. In April 2014 the patient chose to switch oral contraceptives to Seasonale.  Subsequent to starting Seasonale she began to have irregular bleeding nearly every  day. This was evaluated on 05/20/2013 with an endometrial biopsy showing benign suppressed endometrium with increased progesterone effect and an ultrasound showing a uterus that measured 7.1 x 5 cm, an endometrial stripe of 9 mm but with no visualized uterine abnormalities. Both ovaries appeared normal. Pap smear was also normal.  She underwent a D&C on 01/31/2014 to evaluate the abnormal bleeding. Final pathology showed secret Tory endometrium and no evidence of adenosarcoma.    Review of Systems:10 point review of systems is negative except as noted in interval history.   Vitals: Blood pressure 111/73, pulse 94, temperature 98.4 F (36.9 C), temperature source Oral, weight 175 lb 4.8 oz (79.516 kg).  Physical Exam: General : The patient is a healthy woman in no acute distress.  HEENT: normocephalic, extraoccular movements normal; neck is supple without thyromegally  Lynphnodes: Supraclavicular and inguinal nodes not enlarged  Abdomen: Soft, non-tender, no ascites, no organomegally, no masses, no hernias  Pelvic:  EGBUS: Normal female  Vagina: Normal, no lesions , there is some blood in the vagina, the patient is not tolerate a speculum exam very well. Urethra and Bladder: Normal, non-tender  Cervix: Unable to visualize. Uterus: Difficult to palpate There is no tenderness. Bi-manual examination: Non-tender; no adenxal masses or nodularity  Rectal: normal sphincter tone, no masses, no blood  Lower extremities: No edema or varicosities. Normal range of motion      No Known Allergies  Past Medical History  Diagnosis Date  . Asthma   . DUB (dysfunctional uterine bleeding)   . 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  PONV (postoperative nausea and vomiting)     Past Surgical History  Procedure Laterality Date  . D & c hysteroscopy/ endocervical curettage  01-25-2005  . Eua/  dx  hysteroscopy/  d & c  06-17-2005  . Dilation and curettage of  uterus N/A 01/31/2014    Procedure: DILATATION AND CURETTAGE;  Surgeon: Alvino Chapel, MD;  Location: [facility];  Service: Gynecology;  Laterality: N/A;    Current Outpatient Prescriptions  Medication Sig Dispense Refill  . Antipyrine-Benzocaine (AURALGAN) 54-14 MG/ML SOLN       . brompheniramine-pseudoephedrine-DM 30-2-10 MG/5ML syrup       . cefdinir (OMNICEF) 300 MG capsule       . cetirizine (ZYRTEC) 10 MG tablet Take 10 mg by mouth every morning.       Marland Kitchen doxycycline (VIBRA-TABS) 100 MG tablet       . fluticasone (FLONASE) 50 MCG/ACT nasal spray       . fluticasone (FLOVENT HFA) 110 MCG/ACT inhaler Inhale 1 puff into the lungs as needed.      Marland Kitchen levofloxacin (LEVAQUIN) 500 MG tablet       . Multiple Vitamin (MULTIVITAMIN) tablet Take 1 tablet by mouth every morning.       . norethindrone-ethinyl estradiol 1/35 (ORTHO-NOVUM, NORTREL,CYCLAFEM) tablet Take 1 tablet by mouth every evening.       Marland Kitchen oxyCODONE-acetaminophen (PERCOCET/ROXICET) 5-325 MG per tablet        No current facility-administered medications for this visit.    History   Social History  . Marital Status: Married    Spouse Name: N/A    Number of Children: N/A  . Years of Education: N/A   Occupational History  . Not on file.   Social History Main Topics  . Smoking status: Never Smoker   . Smokeless tobacco: Never Used  . Alcohol Use: 1.2 oz/week    1 Glasses of wine, 1 Shots of liquor per week     Comment: OCCASIONAL  . Drug Use: No  . Sexual Activity: Yes    Birth Control/ Protection: Pill   Other Topics Concern  . Not on file   Social History Narrative  . No narrative on file    No family history on file.    CLARKE-PEARSON,Rily Nickey L, MD 02/10/2014, 10:25 AM

## 2014-02-12 ENCOUNTER — Encounter: Payer: Self-pay | Admitting: Gynecologic Oncology

## 2014-04-28 ENCOUNTER — Telehealth: Payer: Self-pay | Admitting: *Deleted

## 2014-04-28 MED ORDER — NORETHINDRONE-ETH ESTRADIOL 1-35 MG-MCG PO TABS: 1.0000 | ORAL_TABLET | Freq: Every evening | ORAL | Status: DC

## 2014-04-28 NOTE — Telephone Encounter (Addendum)
Pt called with request to schedule surgery in Nov 10 with Dr. Alycia Rossetti. Pt also request medication refill for her birth control states " I just noticed I needed my Birth control filled and my regular  Dr said they wont see me because I haven't been there in almost a year. Discussed with pt I will review with" Rx sent to pt pharmacy via escribe.

## 2014-04-29 ENCOUNTER — Telehealth: Payer: Self-pay | Admitting: *Deleted

## 2014-04-29 NOTE — Telephone Encounter (Signed)
Called pt to discuss f/u appt with MD on oct 9 with surgery to follow on Nov 3. Pt will have Vaginal Hysterectomy. LMOVM for pt to call office to confirm information.

## 2014-05-13 ENCOUNTER — Encounter: Payer: Self-pay | Admitting: Gynecology

## 2014-07-01 ENCOUNTER — Telehealth: Payer: Self-pay | Admitting: *Deleted

## 2014-07-01 NOTE — Telephone Encounter (Signed)
Called pt regarding upcoming appt with Dr. Wayland Salinas and surgery Nov 3. Pt requested to move appt to earlier in morning as she is coming from [facility]. Appt moved per pt request

## 2014-08-29 ENCOUNTER — Ambulatory Visit: Payer: 59 | Admitting: Gynecology

## 2014-08-29 ENCOUNTER — Other Ambulatory Visit: Payer: Self-pay | Admitting: *Deleted

## 2014-08-29 ENCOUNTER — Encounter: Payer: Self-pay | Admitting: Gynecology

## 2014-08-29 ENCOUNTER — Ambulatory Visit: Payer: 59 | Attending: Gynecology | Admitting: Gynecology

## 2014-08-29 ENCOUNTER — Telehealth: Payer: Self-pay | Admitting: *Deleted

## 2014-08-29 VITALS — BP 132/82 | HR 107 | Temp 98.5°F | Resp 16

## 2014-08-29 DIAGNOSIS — N938 Other specified abnormal uterine and vaginal bleeding: Secondary | ICD-10-CM

## 2014-08-29 DIAGNOSIS — Z08 Encounter for follow-up examination after completed treatment for malignant neoplasm: Secondary | ICD-10-CM | POA: Insufficient documentation

## 2014-08-29 DIAGNOSIS — Z8542 Personal history of malignant neoplasm of other parts of uterus: Secondary | ICD-10-CM | POA: Insufficient documentation

## 2014-08-29 DIAGNOSIS — C55 Malignant neoplasm of uterus, part unspecified: Secondary | ICD-10-CM

## 2014-08-29 DIAGNOSIS — J45909 Unspecified asthma, uncomplicated: Secondary | ICD-10-CM | POA: Insufficient documentation

## 2014-08-29 DIAGNOSIS — N939 Abnormal uterine and vaginal bleeding, unspecified: Secondary | ICD-10-CM | POA: Diagnosis not present

## 2014-08-29 MED ORDER — CLOBETASOL PROPIONATE 0.05 % EX CREA: 1.0000 "application " | TOPICAL_CREAM | Freq: Two times a day (BID) | CUTANEOUS | Status: DC

## 2014-08-29 NOTE — Progress Notes (Signed)
Consult Note: Gyn-Onc   [name] 33 y.o. female  Chief Complaint  Patient presents with  . Adenocarcinoma of uterus    Assessment : Abnormal uterine bleeding of uncertain origin now resolved following D&C. Past history of adenosarcoma of the uterus. Plan using we'll proceed with vaginal hysterectomy for 09/23/2014. Risks of surgery were reviewed and questions are answered. We intended to leave the tubes and ovaries for hormone function..  Interval history. Patient returns today  for surgical planning. Given her past history of adenosarcoma of the uterus as well as abnormal bleeding, the patient wishes to proceed with vaginal hysterectomy. In the interval, she's been on birth control pills and has had no abnormal bleeding. She denies any other gynecologic history. Overall her health has been good except for some seasonal allergies.  HPI:The patient's gynecologic history dates back to 2006 when she presented with heavy bleeding. A D&C showed an adenosarcoma. The patient wished to preserve fertility and was therefore treated with high-dose progestins and followup D&C in March 2006 showing a minute fragment of adenosarcoma. We continued progestins and a repeat D&C in July 2006 was negative we continue to follow patient with ultrasounds every 6 months. She subsequently was able to get pregnant and now has a 32-year-old son. Over the intervening years the patient was taking Ortho Tri-Cyclen and then more recently Nortrel 1/35. She did well throughout the 4 years she took Nortrel. In April 2014 the patient chose to switch oral contraceptives to Seasonale.  Subsequent to starting Seasonale she began to have irregular bleeding nearly every day. This was evaluated on 05/20/2013 with an endometrial biopsy showing benign suppressed endometrium with increased progesterone effect and an ultrasound showing a uterus that measured 7.1 x 5 cm, an endometrial stripe of 9 mm but with no visualized uterine  abnormalities. Both ovaries appeared normal. Pap smear was also normal.  She underwent a D&C on 01/31/2014 to evaluate the abnormal bleeding. Final pathology showed secretory endometrium and no evidence of adenosarcoma.    Review of Systems:10 point review of systems is negative except as noted in interval history.   Vitals: Blood pressure 132/82, pulse 107, temperature 98.5 F (36.9 C), temperature source Oral, resp. rate 16.  Physical Exam: General : The patient is a healthy woman in no acute distress.  HEENT: normocephalic, extraoccular movements normal; neck is supple without thyromegally  Lynphnodes: Supraclavicular and inguinal nodes not enlarged  Abdomen: Soft, non-tender, no ascites, no organomegally, no masses, no hernias  Pelvic:  EGBUS: Normal female  Vagina: Normal, no lesions , there is some blood in the vagina, the patient is not tolerate a speculum exam very well. Urethra and Bladder: Normal, non-tender  Cervix: Unable to visualize. Uterus: Difficult to palpate There is no tenderness. Bi-manual examination: Non-tender; no adenxal masses or nodularity  Rectal: normal sphincter tone, no masses, no blood  Lower extremities: No edema or varicosities. Normal range of motion      No Known Allergies  Past Medical History  Diagnosis Date  . Asthma   . DUB (dysfunctional uterine bleeding)   . 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 PONV (postoperative nausea and vomiting)     Past Surgical History  Procedure Laterality Date  . D & c hysteroscopy/ endocervical curettage  01-25-2005  . Eua/  dx  hysteroscopy/  d & c  06-17-2005  . Dilation and curettage of uterus N/A 01/31/2014    Procedure:  DILATATION AND CURETTAGE;  Surgeon: Dawn Chapel, MD;  Location: [facility];  Service: Gynecology;  Laterality: N/A;    Current Outpatient Prescriptions  Medication Sig Dispense Refill  . ALBUTEROL SULFATE HFA IN  Inhale into the lungs.      . Antipyrine-Benzocaine (AURALGAN) 54-14 MG/ML SOLN       . brompheniramine-pseudoephedrine-DM 30-2-10 MG/5ML syrup       . cefdinir (OMNICEF) 300 MG capsule       . cetirizine (ZYRTEC) 10 MG tablet Take 10 mg by mouth every morning.       Marland Kitchen doxycycline (VIBRA-TABS) 100 MG tablet       . fluticasone (FLONASE) 50 MCG/ACT nasal spray       . fluticasone (FLOVENT HFA) 110 MCG/ACT inhaler Inhale 1 puff into the lungs as needed.      Marland Kitchen levofloxacin (LEVAQUIN) 500 MG tablet       . Multiple Vitamin (MULTIVITAMIN) tablet Take 1 tablet by mouth every morning.       . norethindrone-ethinyl estradiol 1/35 (ORTHO-NOVUM, NORTREL,CYCLAFEM) tablet Take 1 tablet by mouth every evening.  1 Package  5  . oxyCODONE-acetaminophen (PERCOCET/ROXICET) 5-325 MG per tablet        No current facility-administered medications for this visit.    History   Social History  . Marital Status: Married    Spouse Name: N/A    Number of Children: N/A  . Years of Education: N/A   Occupational History  . Not on file.   Social History Main Topics  . Smoking status: Never Smoker   . Smokeless tobacco: Never Used  . Alcohol Use: 1.2 oz/week    1 Glasses of wine, 1 Shots of liquor per week     Comment: OCCASIONAL  . Drug Use: No  . Sexual Activity: Yes    Birth Control/ Protection: Pill   Other Topics Concern  . Not on file   Social History Narrative  . No narrative on file    No family history on file.    CLARKE-PEARSON,Dawn Depaolis L, MD 08/29/2014, 8:53 AM

## 2014-08-29 NOTE — Addendum Note (Signed)
Addended by: Lucile Crater on: 08/29/2014 09:08 AM   Modules accepted: Orders, Medications

## 2014-08-29 NOTE — Patient Instructions (Signed)
Preparing for your Surgery  Plan for surgery on November 3 with Dr. Dr. Fermin Schwab.  Pre-operative Testing -You will receive a phone call from presurgical testing at [facility] to arrange for a pre-operative testing appointment before your surgery.  This appointment normally occurs one to two weeks before your scheduled surgery.   -Bring your insurance card, copy of an advanced directive if applicable, medication list  -At that visit, you will be asked to sign a consent for a possible blood transfusion in case a transfusion becomes necessary during surgery.  The need for a blood transfusion is rare but having consent is a necessary part of your care.     -You should not be taking blood thinners or aspirin at least ten days prior to surgery unless instructed by your surgeon.  Day Before Surgery at [place] will be asked to take in only clear liquids the day before surgery.  Examples of clear liquids include broths, jello, and clear juices.  You will be advised to have nothing to eat or drink after midnight the evening before.    Your role in recovery Your role is to become active as soon as directed by your doctor, while still giving yourself time to heal.  Rest when you feel tired. You will be asked to do the following in order to speed your recovery:  - Cough and breathe deeply. This helps toclear and expand your lungs and can prevent pneumonia. You may be given a spirometer to practice deep breathing. A staff member will show you how to use the spirometer. - Do mild physical activity. Walking or moving your legs help your circulation and body functions return to normal. A staff member will help you when you try to walk and will provide you with simple exercises. Do not try to get up or walk alone the first time. - Actively manage your pain. Managing your pain lets you move in comfort. We will ask you to rate your pain on a scale of zero to 10. It is your  responsibility to tell your doctor or nurse where and how much you hurt so your pain can be treated.  Special Considerations -If you are diabetic, you may be placed on insulin after surgery to have closer control over your blood sugars to promote healing and recovery.  This does not mean that you will be discharged on insulin.  If applicable, your oral antidiabetics will be resumed when you are tolerating a solid diet.  -Your final pathology results from surgery should be available by the Friday after surgery and the results will be relayed to you when available.  Hysterectomy Information  A hysterectomy is a surgery to remove your uterus. After surgery, you will no longer have periods. Also, you will not be able to get pregnant.  REASONS FOR THIS SURGERY  You have bleeding that is not normal and keeps coming back.  You have lasting (chronic) lower belly (pelvic) pain.  You have a lasting infection.  The lining of your uterus grows outside your uterus.  The lining of your uterus grows in the muscle of your uterus.  Your uterus falls down into your vagina.  You have a growth in your uterus that causes problems.  You have cells that could turn into cancer (precancerous cells).  You have cancer of the uterus or cervix. TYPES  There are 3 types of hysterectomies. Depending on the type, the surgery will:  Remove the top part of the uterus only.  Remove the uterus and the cervix.  Remove the uterus, cervix, and tissue that holds the uterus in place in the lower belly. WAYS A HYSTERECTOMY CAN BE PERFORMED There are 5 ways this surgery can be performed.   A cut (incision) is made in the belly (abdomen). The uterus is taken out through the cut.  A cut is made in the vagina. The uterus is taken out through the cut.  Three or four cuts are made in the belly. A surgical device with a camera is put through one of the cuts. The uterus is cut into small pieces. The uterus is taken out  through the cuts or the vagina.  Three or four cuts are made in the belly. A surgical device with a camera is put through one of the cuts. The uterus is taken out through the vagina.  Three or four cuts are made in the belly. A surgical device that is controlled by a computer makes a visual image. The device helps the surgeon control the surgical tools. The uterus is cut into small pieces. The pieces are taken out through the cuts or through the vagina. WHAT TO EXPECT AFTER THE SURGERY  You will be given pain medicine.  You will need help at home for 3-5 days after surgery.  You will need to see your doctor in 2-4 weeks after surgery.  You may get hot flashes, have night sweats, and have trouble sleeping.  You may need to have Pap tests in the future if your surgery was related to cancer. Talk to your doctor. It is still good to have regular exams. Document Released: 01/30/2012 Document Revised: 08/28/2013 Document Reviewed: 07/15/2013 [facility] Patient Information 2015 [place], [place]. This information is not intended to replace advice given to you by your health care provider. Make sure you discuss any questions you have with your health care provider.

## 2014-08-29 NOTE — Telephone Encounter (Signed)
Called pt regarding appt

## 2014-09-04 ENCOUNTER — Encounter: Payer: Self-pay | Admitting: Gynecology

## 2014-09-11 ENCOUNTER — Ambulatory Visit: Payer: 59 | Admitting: Gynecologic Oncology

## 2014-09-12 ENCOUNTER — Encounter (HOSPITAL_COMMUNITY): Payer: Self-pay | Admitting: Pharmacy Technician

## 2014-09-17 NOTE — Patient Instructions (Addendum)
[name]  09/17/2014                           YOUR PROCEDURE IS SCHEDULED ON:  09/23/14                ENTER FROM [address] - ENTER THRU EMERGENCY ENTRANCE               FOLLOW  SIGNS TO SHORT STAY CENTER                 ARRIVE AT SHORT STAY AT: 5:30 AM               CALL THIS NUMBER IF ANY PROBLEMS THE DAY OF SURGERY :               [phone]                                REMEMBER:   Do not eat food or drink liquids AFTER MIDNIGHT                 _____________________________________________________________________                    Take these medicines the morning of surgery with               A SIPS OF WATER :     Zyrtec / may yse Flonase nasal spray    Do not wear jewelry, make-up   Do not wear lotions, powders, or perfumes.   Do not shave legs or underarms 12 hrs. before surgery (men may shave face)  Do not bring valuables to the hospital.  Contacts, dentures or bridgework may not be worn into surgery.  Leave suitcase in the car. After surgery it may be brought to your room.  For patients admitted to the hospital more than one night, checkout time is            11:00 AM                                                       The day of discharge.   Patients discharged the day of surgery will not be allowed to drive home.            If going home same day of surgery, must have someone stay with you              FIRST 24 hrs at home and arrange for some one to drive you              home from hospital.   ________________________________________________________________________                                                                                                  [place]  FOR SURGERY  Before surgery, you can play an important role.  Because skin is not sterile, your skin needs to be as free of germs as possible.  You can reduce the number of germs on your skin by washing with CHG (chlorahexidine gluconate) soap  before surgery.  CHG is an antiseptic cleaner which kills germs and bonds with the skin to continue killing germs even after washing. Please DO NOT use if you have an allergy to CHG or antibacterial soaps.  If your skin becomes reddened/irritated stop using the CHG and inform your nurse when you arrive at Short Stay. Do not shave (including legs and underarms) for at least 48 hours prior to the first CHG shower.  You may shave your face. Please follow these instructions carefully:   1.  Shower with CHG Soap the night before surgery and the  morning of Surgery.   2.  If you choose to wash your hair, wash your hair first as usual with your  normal  Shampoo.   3.  After you shampoo, rinse your hair and body thoroughly to remove the  shampoo.                                         4.  Use CHG as you would any other liquid soap.  You can apply chg directly  to the skin and wash . Gently wash with scrungie or clean wascloth    5.  Apply the CHG Soap to your body ONLY FROM THE NECK DOWN.   Do not use on open                           Wound or open sores. Avoid contact with eyes, ears mouth and genitals (private parts).                        Genitals (private parts) with your normal soap.              6.  Wash thoroughly, paying special attention to the area where your surgery  will be performed.   7.  Thoroughly rinse your body with warm water from the neck down.   8.  DO NOT shower/wash with your normal soap after using and rinsing off  the CHG Soap .                9.  Pat yourself dry with a clean towel.             10.  Wear clean pajamas.             11.  Place clean sheets on your bed the night of your first shower and do not  sleep with pets.  Day of Surgery : Do not apply any lotions/deodorants the morning of surgery.  Please wear clean clothes to the hospital/surgery center.  FAILURE TO FOLLOW THESE INSTRUCTIONS MAY RESULT IN THE CANCELLATION OF YOUR SURGERY    PATIENT  SIGNATURE_________________________________  ______________________________________________________________________     [name]  An incentive spirometer is a tool that can help keep your lungs clear and active. This tool measures how well you are filling your lungs with each breath. Taking long deep breaths may help reverse or decrease the chance of developing breathing (pulmonary) problems (especially infection) following:  A long period  of time when you are unable to move or be active. BEFORE THE PROCEDURE   If the spirometer includes an indicator to show your best effort, your nurse or respiratory therapist will set it to a desired goal.  If possible, sit up straight or lean slightly forward. Try not to slouch.  Hold the incentive spirometer in an upright position. INSTRUCTIONS FOR USE  1. Sit on the edge of your bed if possible, or sit up as far as you can in bed or on a chair. 2. Hold the incentive spirometer in an upright position. 3. Breathe out normally. 4. Place the mouthpiece in your mouth and seal your lips tightly around it. 5. Breathe in slowly and as deeply as possible, raising the piston or the ball toward the top of the column. 6. Hold your breath for 3-5 seconds or for as long as possible. Allow the piston or ball to fall to the bottom of the column. 7. Remove the mouthpiece from your mouth and breathe out normally. 8. Rest for a few seconds and repeat Steps 1 through 7 at least 10 times every 1-2 hours when you are awake. Take your time and take a few normal breaths between deep breaths. 9. The spirometer may include an indicator to show your best effort. Use the indicator as a goal to work toward during each repetition. 10. After each set of 10 deep breaths, practice coughing to be sure your lungs are clear. If you have an incision (the cut made at the time of surgery), support your incision when coughing by placing a pillow or rolled up towels firmly  against it. Once you are able to get out of bed, walk around indoors and cough well. You may stop using the incentive spirometer when instructed by your caregiver.  RISKS AND COMPLICATIONS  Take your time so you do not get dizzy or light-headed.  If you are in pain, you may need to take or ask for pain medication before doing incentive spirometry. It is harder to take a deep breath if you are having pain. AFTER USE  Rest and breathe slowly and easily.  It can be helpful to keep track of a log of your progress. Your caregiver can provide you with a simple table to help with this. If you are using the spirometer at home, follow these instructions: [place] IF:   You are having difficultly using the spirometer.  You have trouble using the spirometer as often as instructed.  Your pain medication is not giving enough relief while using the spirometer.  You develop fever of 100.5 F (38.1 C) or higher. SEEK IMMEDIATE MEDICAL CARE IF:   You cough up bloody sputum that had not been present before.  You develop fever of 102 F (38.9 C) or greater.  You develop worsening pain at or near the incision site. MAKE SURE YOU:   Understand these instructions.  Will watch your condition.  Will get help right away if you are not doing well or get worse. Document Released: 03/20/2007 Document Revised: 01/30/2012 Document Reviewed: 05/21/2007 [employer] Patient Information 2014 [employer], [place].   ________________________________________________________________________  WHAT IS A BLOOD TRANSFUSION? Blood Transfusion Information  A transfusion is the replacement of blood or some of its parts. Blood is made up of multiple cells which provide different functions.  Red blood cells carry oxygen and are used for blood loss replacement.  White blood cells fight against infection.  Platelets control bleeding.  Plasma helps clot blood.  Other blood products are available for  specialized needs, such as hemophilia or other clotting disorders. BEFORE THE TRANSFUSION  Who gives blood for transfusions?   Healthy volunteers who are fully evaluated to make sure their blood is safe. This is blood bank blood. Transfusion therapy is the safest it has ever been in the practice of medicine. Before blood is taken from a donor, a complete history is taken to make sure that person has no history of diseases nor engages in risky social behavior (examples are intravenous drug use or sexual activity with multiple partners). The donor's travel history is screened to minimize risk of transmitting infections, such as malaria. The donated blood is tested for signs of infectious diseases, such as HIV and hepatitis. The blood is then tested to be sure it is compatible with you in order to minimize the chance of a transfusion reaction. If you or a relative donates blood, this is often done in anticipation of surgery and is not appropriate for emergency situations. It takes many days to process the donated blood. RISKS AND COMPLICATIONS Although transfusion therapy is very safe and saves many lives, the main dangers of transfusion include:   Getting an infectious disease.  Developing a transfusion reaction. This is an allergic reaction to something in the blood you were given. Every precaution is taken to prevent this. The decision to have a blood transfusion has been considered carefully by your caregiver before blood is given. Blood is not given unless the benefits outweigh the risks. AFTER THE TRANSFUSION  Right after receiving a blood transfusion, you will usually feel much better and more energetic. This is especially true if your red blood cells have gotten low (anemic). The transfusion raises the level of the red blood cells which carry oxygen, and this usually causes an energy increase.  The nurse administering the transfusion will monitor you carefully for complications. HOME CARE  INSTRUCTIONS  No special instructions are needed after a transfusion. You may find your energy is better. Speak with your caregiver about any limitations on activity for underlying diseases you may have. SEEK MEDICAL CARE IF:   Your condition is not improving after your transfusion.  You develop redness or irritation at the intravenous (IV) site. SEEK IMMEDIATE MEDICAL CARE IF:  Any of the following symptoms occur over the next 12 hours:  Shaking chills.  You have a temperature by mouth above 102 F (38.9 C), not controlled by medicine.  Chest, back, or muscle pain.  People around you feel you are not acting correctly or are confused.  Shortness of breath or difficulty breathing.  Dizziness and fainting.  You get a rash or develop hives.  You have a decrease in urine output.  Your urine turns a dark color or changes to pink, red, or brown. Any of the following symptoms occur over the next 10 days:  You have a temperature by mouth above 102 F (38.9 C), not controlled by medicine.  Shortness of breath.  Weakness after normal activity.  The white part of the eye turns yellow (jaundice).  You have a decrease in the amount of urine or are urinating less often.  Your urine turns a dark color or changes to pink, red, or brown. Document Released: 11/04/2000 Document Revised: 01/30/2012 Document Reviewed: 06/23/2008 [facility] Patient Information 2014 [place], [place].  _______________________________________________________________________

## 2014-09-18 ENCOUNTER — Encounter (HOSPITAL_COMMUNITY): Payer: Self-pay

## 2014-09-18 ENCOUNTER — Encounter (HOSPITAL_COMMUNITY)
Admission: RE | Admit: 2014-09-18 | Discharge: 2014-09-18 | Disposition: A | Discharge status: Home / Self Care | Payer: 59 | Source: Ambulatory Visit | Attending: Obstetrics & Gynecology | Admitting: Obstetrics & Gynecology

## 2014-09-18 ENCOUNTER — Ambulatory Visit (HOSPITAL_COMMUNITY)
Admission: RE | Admit: 2014-09-18 | Discharge: 2014-09-18 | Disposition: A | Discharge status: Home / Self Care | Payer: 59 | Source: Ambulatory Visit | Attending: Gynecology | Admitting: Gynecology

## 2014-09-18 DIAGNOSIS — C55 Malignant neoplasm of uterus, part unspecified: Secondary | ICD-10-CM | POA: Insufficient documentation

## 2014-09-18 DIAGNOSIS — Z01810 Encounter for preprocedural cardiovascular examination: Secondary | ICD-10-CM | POA: Insufficient documentation

## 2014-09-18 HISTORY — DX: Other acariasis: B88.0

## 2014-09-18 HISTORY — DX: Personal history of other diseases of the musculoskeletal system and connective tissue: Z87.39

## 2014-09-18 LAB — URINALYSIS, ROUTINE W REFLEX MICROSCOPIC
Bilirubin Urine: NEGATIVE
GLUCOSE, UA: NEGATIVE mg/dL
Hgb urine dipstick: NEGATIVE
Ketones, ur: NEGATIVE mg/dL
Leukocytes, UA: NEGATIVE
Nitrite: NEGATIVE
Protein, ur: NEGATIVE mg/dL
Specific Gravity, Urine: 1.022 (ref 1.005–1.030)
Urobilinogen, UA: 0.2 mg/dL (ref 0.0–1.0)
pH: 6.5 (ref 5.0–8.0)

## 2014-09-18 LAB — COMPREHENSIVE METABOLIC PANEL
ALT: 15 U/L (ref 0–35)
AST: 17 U/L (ref 0–37)
Albumin: 3.6 g/dL (ref 3.5–5.2)
Alkaline Phosphatase: 55 U/L (ref 39–117)
Anion gap: 13 (ref 5–15)
BUN: 12 mg/dL (ref 6–23)
CALCIUM: 9.2 mg/dL (ref 8.4–10.5)
CO2: 22 meq/L (ref 19–32)
Chloride: 101 mEq/L (ref 96–112)
Creatinine, Ser: 0.81 mg/dL (ref 0.50–1.10)
GLUCOSE: 103 mg/dL — AB (ref 70–99)
Potassium: 4 mEq/L (ref 3.7–5.3)
SODIUM: 136 meq/L — AB (ref 137–147)
Total Bilirubin: <0.2 mg/dL — ABNORMAL LOW (ref 0.3–1.2)
Total Protein: 7.3 g/dL (ref 6.0–8.3)

## 2014-09-18 LAB — CBC WITH DIFFERENTIAL/PLATELET
Basophils Absolute: 0.1 10*3/uL (ref 0.0–0.1)
Basophils Relative: 1 % (ref 0–1)
EOS PCT: 4 % (ref 0–5)
Eosinophils Absolute: 0.4 10*3/uL (ref 0.0–0.7)
HEMATOCRIT: 39.7 % (ref 36.0–46.0)
HEMOGLOBIN: 13 g/dL (ref 12.0–15.0)
LYMPHS ABS: 2 10*3/uL (ref 0.7–4.0)
LYMPHS PCT: 21 % (ref 12–46)
MCH: 28.8 pg (ref 26.0–34.0)
MCHC: 32.7 g/dL (ref 30.0–36.0)
MCV: 88 fL (ref 78.0–100.0)
MONOS PCT: 5 % (ref 3–12)
Monocytes Absolute: 0.5 10*3/uL (ref 0.1–1.0)
Neutro Abs: 6.3 10*3/uL (ref 1.7–7.7)
Neutrophils Relative %: 69 % (ref 43–77)
Platelets: 370 10*3/uL (ref 150–400)
RBC: 4.51 MIL/uL (ref 3.87–5.11)
RDW: 13.3 % (ref 11.5–15.5)
WBC: 9.2 10*3/uL (ref 4.0–10.5)

## 2014-09-18 LAB — ABO/RH: ABO/RH(D): A POS

## 2014-09-18 LAB — PREGNANCY, URINE: Preg Test, Ur: NEGATIVE

## 2014-09-18 NOTE — Progress Notes (Signed)
Pt notified she does not need to be on clear liquids only the day before surgery but no food or liquids after midnight.

## 2014-09-22 NOTE — Anesthesia Preprocedure Evaluation (Addendum)
Anesthesia Evaluation  Patient identified by MRN, date of birth, ID band Patient awake    Reviewed: Allergy & Precautions, H&P , NPO status , Patient's Chart, lab work & pertinent test results  History of Anesthesia Complications (+) PONV and history of anesthetic complications  Airway Mallampati: II  TM Distance: >3 FB Neck ROM: Full    Dental no notable dental hx. (+) Teeth Intact, Dental Advisory Given   Pulmonary asthma (used an inhaler once with bad seasonal allergies) ,  breath sounds clear to auscultation  Pulmonary exam normal       Cardiovascular Exercise Tolerance: Good negative cardio ROS  Rhythm:Regular Rate:Normal     Neuro/Psych  Headaches, negative psych ROS   GI/Hepatic negative GI ROS, Neg liver ROS,   Endo/Other  Morbid obesity  Renal/GU negative Renal ROS  negative genitourinary   Musculoskeletal negative musculoskeletal ROS (+)   Abdominal   Peds negative pediatric ROS (+)  Hematology negative hematology ROS (+)   Anesthesia Other Findings   Reproductive/Obstetrics negative OB ROS                            Anesthesia Physical Anesthesia Plan  ASA: II  Anesthesia Plan: General   Post-op Pain Management:    Induction: Intravenous  Airway Management Planned: Oral ETT  Additional Equipment:   Intra-op Plan:   Post-operative Plan: Extubation in OR  Informed Consent: I have reviewed the patients History and Physical, chart, labs and discussed the procedure including the risks, benefits and alternatives for the proposed anesthesia with the patient or authorized representative who has indicated his/her understanding and acceptance.   Dental advisory given  Plan Discussed with: CRNA  Anesthesia Plan Comments:         Anesthesia Quick Evaluation

## 2014-09-23 ENCOUNTER — Ambulatory Visit (HOSPITAL_COMMUNITY): Payer: 59 | Admitting: Anesthesiology

## 2014-09-23 ENCOUNTER — Encounter (HOSPITAL_COMMUNITY): Payer: Self-pay | Admitting: *Deleted

## 2014-09-23 ENCOUNTER — Encounter (HOSPITAL_COMMUNITY)
Admission: RE | Disposition: A | Discharge status: Home / Self Care | Payer: Self-pay | Source: Ambulatory Visit | Attending: Obstetrics & Gynecology

## 2014-09-23 ENCOUNTER — Ambulatory Visit (HOSPITAL_COMMUNITY)
Admission: RE | Admit: 2014-09-23 | Discharge: 2014-09-24 | Disposition: A | Discharge status: Home / Self Care | Payer: 59 | Source: Ambulatory Visit | Attending: Obstetrics & Gynecology | Admitting: Obstetrics & Gynecology

## 2014-09-23 DIAGNOSIS — N939 Abnormal uterine and vaginal bleeding, unspecified: Secondary | ICD-10-CM | POA: Diagnosis present

## 2014-09-23 DIAGNOSIS — Z79899 Other long term (current) drug therapy: Secondary | ICD-10-CM | POA: Insufficient documentation

## 2014-09-23 DIAGNOSIS — J45909 Unspecified asthma, uncomplicated: Secondary | ICD-10-CM | POA: Diagnosis not present

## 2014-09-23 DIAGNOSIS — N808 Other endometriosis: Secondary | ICD-10-CM

## 2014-09-23 DIAGNOSIS — C55 Malignant neoplasm of uterus, part unspecified: Secondary | ICD-10-CM | POA: Diagnosis not present

## 2014-09-23 DIAGNOSIS — N938 Other specified abnormal uterine and vaginal bleeding: Secondary | ICD-10-CM

## 2014-09-23 DIAGNOSIS — N921 Excessive and frequent menstruation with irregular cycle: Secondary | ICD-10-CM | POA: Diagnosis present

## 2014-09-23 HISTORY — PX: VAGINAL HYSTERECTOMY: SHX2639

## 2014-09-23 LAB — TYPE AND SCREEN
ABO/RH(D): A POS
Antibody Screen: NEGATIVE

## 2014-09-23 SURGERY — HYSTERECTOMY, VAGINAL
Anesthesia: General

## 2014-09-23 MED ORDER — HYDROMORPHONE HCL 1 MG/ML IJ SOLN
0.2500 mg | INTRAMUSCULAR | Status: DC | PRN
  Administered 2014-09-23 (×2): 0.5 mg via INTRAVENOUS

## 2014-09-23 MED ORDER — SODIUM CHLORIDE 0.9 % IJ SOLN
INTRAMUSCULAR | Status: AC
  Filled 2014-09-23: qty 20

## 2014-09-23 MED ORDER — FENTANYL CITRATE 0.05 MG/ML IJ SOLN
INTRAMUSCULAR | Status: AC
  Filled 2014-09-23: qty 2

## 2014-09-23 MED ORDER — GLYCOPYRROLATE 0.2 MG/ML IJ SOLN
INTRAMUSCULAR | Status: DC | PRN
  Administered 2014-09-23: .6 mg via INTRAVENOUS

## 2014-09-23 MED ORDER — ONDANSETRON HCL 4 MG PO TABS: 4.0000 mg | ORAL_TABLET | Freq: Four times a day (QID) | ORAL | Status: DC | PRN

## 2014-09-23 MED ORDER — DEXAMETHASONE SODIUM PHOSPHATE 10 MG/ML IJ SOLN
INTRAMUSCULAR | Status: AC
  Filled 2014-09-23: qty 1

## 2014-09-23 MED ORDER — ACETAMINOPHEN 10 MG/ML IV SOLN
1000.0000 mg | Freq: Once | INTRAVENOUS | Status: AC
  Administered 2014-09-23: 1000 mg via INTRAVENOUS
  Filled 2014-09-23: qty 100

## 2014-09-23 MED ORDER — METOCLOPRAMIDE HCL 5 MG/ML IJ SOLN: INTRAMUSCULAR | Status: DC | PRN

## 2014-09-23 MED ORDER — PROPOFOL 10 MG/ML IV BOLUS
INTRAVENOUS | Status: DC | PRN
  Administered 2014-09-23: 200 mg via INTRAVENOUS

## 2014-09-23 MED ORDER — OXYCODONE-ACETAMINOPHEN 5-325 MG PO TABS
1.0000 | ORAL_TABLET | ORAL | Status: DC | PRN
  Administered 2014-09-24: 1 via ORAL
  Filled 2014-09-23: qty 1

## 2014-09-23 MED ORDER — ALBUTEROL SULFATE HFA 108 (90 BASE) MCG/ACT IN AERS
INHALATION_SPRAY | RESPIRATORY_TRACT | Status: DC | PRN
  Administered 2014-09-23: 3 via RESPIRATORY_TRACT
  Administered 2014-09-23: 1 via RESPIRATORY_TRACT

## 2014-09-23 MED ORDER — CISATRACURIUM BESYLATE (PF) 10 MG/5ML IV SOLN
INTRAVENOUS | Status: DC | PRN
  Administered 2014-09-23: 6 mg via INTRAVENOUS
  Administered 2014-09-23: 2 mg via INTRAVENOUS

## 2014-09-23 MED ORDER — LIDOCAINE-EPINEPHRINE 1 %-1:100000 IJ SOLN
INTRAMUSCULAR | Status: DC | PRN
  Administered 2014-09-23: 9 mL

## 2014-09-23 MED ORDER — KCL IN DEXTROSE-NACL 20-5-0.45 MEQ/L-%-% IV SOLN
INTRAVENOUS | Status: AC
  Filled 2014-09-23: qty 1000

## 2014-09-23 MED ORDER — METOCLOPRAMIDE HCL 5 MG/ML IJ SOLN
INTRAMUSCULAR | Status: AC
  Filled 2014-09-23: qty 2

## 2014-09-23 MED ORDER — ONDANSETRON HCL 4 MG/2ML IJ SOLN
INTRAMUSCULAR | Status: DC | PRN
  Administered 2014-09-23 (×2): 2 mg via INTRAVENOUS

## 2014-09-23 MED ORDER — SUCCINYLCHOLINE CHLORIDE 20 MG/ML IJ SOLN
INTRAMUSCULAR | Status: DC | PRN
  Administered 2014-09-23: 100 mg via INTRAVENOUS

## 2014-09-23 MED ORDER — KETOROLAC TROMETHAMINE 30 MG/ML IJ SOLN
INTRAMUSCULAR | Status: AC
  Filled 2014-09-23: qty 1

## 2014-09-23 MED ORDER — ONDANSETRON HCL 4 MG/2ML IJ SOLN: 4.0000 mg | Freq: Once | INTRAMUSCULAR | Status: DC | PRN

## 2014-09-23 MED ORDER — MIDAZOLAM HCL 5 MG/5ML IJ SOLN
INTRAMUSCULAR | Status: DC | PRN
  Administered 2014-09-23 (×4): 1 mg via INTRAVENOUS

## 2014-09-23 MED ORDER — SUFENTANIL CITRATE 50 MCG/ML IV SOLN
INTRAVENOUS | Status: AC
  Filled 2014-09-23: qty 1

## 2014-09-23 MED ORDER — DEXAMETHASONE SODIUM PHOSPHATE 10 MG/ML IJ SOLN
INTRAMUSCULAR | Status: DC | PRN
  Administered 2014-09-23: 10 mg via INTRAVENOUS

## 2014-09-23 MED ORDER — PROPOFOL 10 MG/ML IV BOLUS
INTRAVENOUS | Status: AC
  Filled 2014-09-23: qty 20

## 2014-09-23 MED ORDER — CEFAZOLIN SODIUM-DEXTROSE 2-3 GM-% IV SOLR
INTRAVENOUS | Status: AC
  Filled 2014-09-23: qty 50

## 2014-09-23 MED ORDER — METOCLOPRAMIDE HCL 5 MG/ML IJ SOLN
INTRAMUSCULAR | Status: DC | PRN
  Administered 2014-09-23: 10 mg via INTRAVENOUS

## 2014-09-23 MED ORDER — CEFAZOLIN SODIUM-DEXTROSE 2-3 GM-% IV SOLR
2.0000 g | INTRAVENOUS | Status: AC
  Administered 2014-09-23: 2 g via INTRAVENOUS

## 2014-09-23 MED ORDER — ALBUTEROL SULFATE (2.5 MG/3ML) 0.083% IN NEBU: 2.5000 mg | INHALATION_SOLUTION | RESPIRATORY_TRACT | Status: DC | PRN

## 2014-09-23 MED ORDER — KCL IN DEXTROSE-NACL 20-5-0.45 MEQ/L-%-% IV SOLN
INTRAVENOUS | Status: DC
  Administered 2014-09-23: 75 mL via INTRAVENOUS
  Administered 2014-09-23: 10:00:00 via INTRAVENOUS
  Filled 2014-09-23 (×3): qty 1000

## 2014-09-23 MED ORDER — MIDAZOLAM HCL 2 MG/2ML IJ SOLN
INTRAMUSCULAR | Status: AC
  Filled 2014-09-23: qty 2

## 2014-09-23 MED ORDER — ATROPINE SULFATE 0.4 MG/ML IJ SOLN
INTRAMUSCULAR | Status: AC
  Filled 2014-09-23: qty 1

## 2014-09-23 MED ORDER — FENTANYL CITRATE 0.05 MG/ML IJ SOLN
INTRAMUSCULAR | Status: DC | PRN
  Administered 2014-09-23: 50 ug via INTRAVENOUS
  Administered 2014-09-23: 100 ug via INTRAVENOUS
  Administered 2014-09-23 (×2): 50 ug via INTRAVENOUS

## 2014-09-23 MED ORDER — FENTANYL CITRATE 0.05 MG/ML IJ SOLN
25.0000 ug | INTRAMUSCULAR | Status: DC | PRN
  Administered 2014-09-23 (×2): 50 ug via INTRAVENOUS

## 2014-09-23 MED ORDER — KETOROLAC TROMETHAMINE 30 MG/ML IJ SOLN
30.0000 mg | Freq: Four times a day (QID) | INTRAMUSCULAR | Status: DC | PRN
  Administered 2014-09-23 (×2): 30 mg via INTRAVENOUS
  Filled 2014-09-23: qty 1

## 2014-09-23 MED ORDER — ONDANSETRON HCL 4 MG/2ML IJ SOLN
INTRAMUSCULAR | Status: AC
  Filled 2014-09-23: qty 2

## 2014-09-23 MED ORDER — EPHEDRINE SULFATE 50 MG/ML IJ SOLN
INTRAMUSCULAR | Status: AC
  Filled 2014-09-23: qty 1

## 2014-09-23 MED ORDER — NEOSTIGMINE METHYLSULFATE 10 MG/10ML IV SOLN
INTRAVENOUS | Status: AC
  Filled 2014-09-23: qty 1

## 2014-09-23 MED ORDER — SCOPOLAMINE 1 MG/3DAYS TD PT72
MEDICATED_PATCH | TRANSDERMAL | Status: AC
  Filled 2014-09-23: qty 1

## 2014-09-23 MED ORDER — HYDROMORPHONE HCL 1 MG/ML IJ SOLN
INTRAMUSCULAR | Status: AC
  Filled 2014-09-23: qty 1

## 2014-09-23 MED ORDER — GLYCOPYRROLATE 0.2 MG/ML IJ SOLN
INTRAMUSCULAR | Status: AC
  Filled 2014-09-23: qty 3

## 2014-09-23 MED ORDER — SCOPOLAMINE 1 MG/3DAYS TD PT72
MEDICATED_PATCH | TRANSDERMAL | Status: DC | PRN
  Administered 2014-09-23: 1 via TRANSDERMAL

## 2014-09-23 MED ORDER — KETOROLAC TROMETHAMINE 30 MG/ML IJ SOLN: 30.0000 mg | Freq: Four times a day (QID) | INTRAMUSCULAR | Status: DC | PRN

## 2014-09-23 MED ORDER — ONDANSETRON HCL 4 MG/2ML IJ SOLN
4.0000 mg | Freq: Four times a day (QID) | INTRAMUSCULAR | Status: DC | PRN
  Administered 2014-09-23: 4 mg via INTRAVENOUS
  Filled 2014-09-23: qty 2

## 2014-09-23 MED ORDER — NEOSTIGMINE METHYLSULFATE 10 MG/10ML IV SOLN
INTRAVENOUS | Status: DC | PRN
Start: 2014-09-23 — End: 2014-09-23
  Administered 2014-09-23: 5 mg via INTRAVENOUS

## 2014-09-23 MED ORDER — ALBUTEROL SULFATE HFA 108 (90 BASE) MCG/ACT IN AERS
INHALATION_SPRAY | RESPIRATORY_TRACT | Status: AC
  Filled 2014-09-23: qty 6.7

## 2014-09-23 MED ORDER — ENOXAPARIN SODIUM 40 MG/0.4ML ~~LOC~~ SOLN
40.0000 mg | SUBCUTANEOUS | Status: AC
  Administered 2014-09-23: 40 mg via SUBCUTANEOUS
  Filled 2014-09-23: qty 0.4

## 2014-09-23 MED ORDER — FENTANYL CITRATE 0.05 MG/ML IJ SOLN
INTRAMUSCULAR | Status: AC
  Filled 2014-09-23: qty 5

## 2014-09-23 MED ORDER — LIDOCAINE HCL (CARDIAC) 20 MG/ML IV SOLN
INTRAVENOUS | Status: DC | PRN
  Administered 2014-09-23: 75 mg via INTRAVENOUS

## 2014-09-23 MED ORDER — OXYCODONE-ACETAMINOPHEN 5-325 MG PO TABS: 2.0000 | ORAL_TABLET | Freq: Four times a day (QID) | ORAL | Status: DC | PRN

## 2014-09-23 MED ORDER — LIDOCAINE-EPINEPHRINE (PF) 1 %-1:200000 IJ SOLN
INTRAMUSCULAR | Status: AC
  Filled 2014-09-23: qty 10

## 2014-09-23 MED ORDER — CISATRACURIUM BESYLATE 20 MG/10ML IV SOLN
INTRAVENOUS | Status: AC
  Filled 2014-09-23: qty 10

## 2014-09-23 MED ORDER — LACTATED RINGERS IV SOLN
INTRAVENOUS | Status: DC | PRN
Start: 2014-09-23 — End: 2014-09-23
  Administered 2014-09-23 (×2): via INTRAVENOUS

## 2014-09-23 SURGICAL SUPPLY — 31 items
BLADE HEX COATED 2.75 (ELECTRODE) ×3 IMPLANT
COVER MAYO STAND STRL (DRAPES) ×3 IMPLANT
DRAPE SHEET LG 3/4 BI-LAMINATE (DRAPES) ×3 IMPLANT
ELECT REM PT RETURN 9FT ADLT (ELECTROSURGICAL) ×3
ELECTRODE REM PT RTRN 9FT ADLT (ELECTROSURGICAL) ×1 IMPLANT
GAUZE SPONGE 4X4 16PLY XRAY LF (GAUZE/BANDAGES/DRESSINGS) ×5 IMPLANT
GLOVE BIO SURGEON STRL SZ 6.5 (GLOVE) ×2 IMPLANT
GLOVE BIO SURGEON STRL SZ7.5 (GLOVE) ×9 IMPLANT
GLOVE BIO SURGEONS STRL SZ 6.5 (GLOVE) ×1
GOWN STRL REUS W/TWL LRG LVL3 (GOWN DISPOSABLE) ×6 IMPLANT
GOWN STRL REUS W/TWL XL LVL3 (GOWN DISPOSABLE) ×3 IMPLANT
LEGGING LITHOTOMY PAIR STRL (DRAPES) ×3 IMPLANT
MARKER SKIN DUAL TIP RULER LAB (MISCELLANEOUS) ×2 IMPLANT
NDL MAYO 6 CRC TAPER PT (NEEDLE) ×1 IMPLANT
NEEDLE MAYO 6 CRC TAPER PT (NEEDLE) ×3 IMPLANT
NEEDLE SPNL 18GX3.5 QUINCKE PK (NEEDLE) ×3 IMPLANT
NS IRRIG 1000ML POUR BTL (IV SOLUTION) ×3 IMPLANT
PACK LITHOTOMY IV (CUSTOM PROCEDURE TRAY) ×3 IMPLANT
SHEET LAVH (DRAPES) ×3 IMPLANT
SPONGE LAP 18X18 X RAY DECT (DISPOSABLE) ×6 IMPLANT
SPONGE LAP 4X18 X RAY DECT (DISPOSABLE) ×6 IMPLANT
SUT VIC AB 0 CT1 36 (SUTURE) ×3 IMPLANT
SUT VIC AB 2-0 CT2 27 (SUTURE) ×46 IMPLANT
SUT VIC AB 2-0 SH 27 (SUTURE)
SUT VIC AB 2-0 SH 27X BRD (SUTURE) IMPLANT
SUT VIC AB 3-0 CTX 36 (SUTURE) IMPLANT
SUT VICRYL 2 0 18  UND BR (SUTURE) ×2
SUT VICRYL 2 0 18 UND BR (SUTURE) ×1 IMPLANT
SYR CONTROL 10ML LL (SYRINGE) ×3 IMPLANT
TOWEL OR 17X26 10 PK STRL BLUE (TOWEL DISPOSABLE) ×3 IMPLANT
TRAY FOLEY CATH 14FRSI W/METER (CATHETERS) ×3 IMPLANT

## 2014-09-23 NOTE — Anesthesia Postprocedure Evaluation (Signed)
  Anesthesia Post-op Note  Patient: [name]  Procedure(s) Performed: Procedure(s) (LRB): HYSTERECTOMY VAGINAL (N/A)  Patient Location: PACU  Anesthesia Type: General  Level of Consciousness: awake and alert   Airway and Oxygen Therapy: Patient Spontanous Breathing  Post-op Pain: mild  Post-op Assessment: Post-op Vital signs reviewed, Patient's Cardiovascular Status Stable, Respiratory Function Stable, Patent Airway and No signs of Nausea or vomiting  Last Vitals:  Filed Vitals:   09/23/14 0930  BP: 104/56  Pulse: 88  Temp:   Resp: 13    Post-op Vital Signs: stable   Complications: No apparent anesthesia complications

## 2014-09-23 NOTE — Plan of Care (Signed)
Problem: Phase II Progression Outcomes Goal: Foley discontinued Outcome: Completed/Met Date Met:  09/23/14

## 2014-09-23 NOTE — H&P (View-Only) (Signed)
Consult Note: Gyn-Onc   [name] 33 y.o. female  Chief Complaint  Patient presents with  . Adenocarcinoma of uterus    Assessment : Abnormal uterine bleeding of uncertain origin now resolved following D&C. Past history of adenosarcoma of the uterus. Plan using we'll proceed with vaginal hysterectomy for 09/23/2014. Risks of surgery were reviewed and questions are answered. We intended to leave the tubes and ovaries for hormone function..  Interval history. Patient returns today  for surgical planning. Given her past history of adenosarcoma of the uterus as well as abnormal bleeding, the patient wishes to proceed with vaginal hysterectomy. In the interval, she's been on birth control pills and has had no abnormal bleeding. She denies any other gynecologic history. Overall her health has been good except for some seasonal allergies.  HPI:The patient's gynecologic history dates back to 2006 when she presented with heavy bleeding. A D&C showed an adenosarcoma. The patient wished to preserve fertility and was therefore treated with high-dose progestins and followup D&C in March 2006 showing a minute fragment of adenosarcoma. We continued progestins and a repeat D&C in July 2006 was negative we continue to follow patient with ultrasounds every 6 months. She subsequently was able to get pregnant and now has a 58-year-old son. Over the intervening years the patient was taking Ortho Tri-Cyclen and then more recently Nortrel 1/35. She did well throughout the 4 years she took Nortrel. In April 2014 the patient chose to switch oral contraceptives to Seasonale.  Subsequent to starting Seasonale she began to have irregular bleeding nearly every day. This was evaluated on 05/20/2013 with an endometrial biopsy showing benign suppressed endometrium with increased progesterone effect and an ultrasound showing a uterus that measured 7.1 x 5 cm, an endometrial stripe of 9 mm but with no visualized uterine  abnormalities. Both ovaries appeared normal. Pap smear was also normal.  She underwent a D&C on 01/31/2014 to evaluate the abnormal bleeding. Final pathology showed secretory endometrium and no evidence of adenosarcoma.    Review of Systems:10 point review of systems is negative except as noted in interval history.   Vitals: Blood pressure 132/82, pulse 107, temperature 98.5 F (36.9 C), temperature source Oral, resp. rate 16.  Physical Exam: General : The patient is a healthy woman in no acute distress.  HEENT: normocephalic, extraoccular movements normal; neck is supple without thyromegally  Lynphnodes: Supraclavicular and inguinal nodes not enlarged  Abdomen: Soft, non-tender, no ascites, no organomegally, no masses, no hernias  Pelvic:  EGBUS: Normal female  Vagina: Normal, no lesions , there is some blood in the vagina, the patient is not tolerate a speculum exam very well. Urethra and Bladder: Normal, non-tender  Cervix: Unable to visualize. Uterus: Difficult to palpate There is no tenderness. Bi-manual examination: Non-tender; no adenxal masses or nodularity  Rectal: normal sphincter tone, no masses, no blood  Lower extremities: No edema or varicosities. Normal range of motion      No Known Allergies  Past Medical History  Diagnosis Date  . Asthma   . DUB (dysfunctional uterine bleeding)   . 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 PONV (postoperative nausea and vomiting)     Past Surgical History  Procedure Laterality Date  . D & c hysteroscopy/ endocervical curettage  01-25-2005  . Eua/  dx  hysteroscopy/  d & c  06-17-2005  . Dilation and curettage of uterus N/A 01/31/2014    Procedure:  DILATATION AND CURETTAGE;  Surgeon: Alvino Chapel, MD;  Location: [facility];  Service: Gynecology;  Laterality: N/A;    Current Outpatient Prescriptions  Medication Sig Dispense Refill  . ALBUTEROL SULFATE HFA IN  Inhale into the lungs.      . Antipyrine-Benzocaine (AURALGAN) 54-14 MG/ML SOLN       . brompheniramine-pseudoephedrine-DM 30-2-10 MG/5ML syrup       . cefdinir (OMNICEF) 300 MG capsule       . cetirizine (ZYRTEC) 10 MG tablet Take 10 mg by mouth every morning.       Marland Kitchen doxycycline (VIBRA-TABS) 100 MG tablet       . fluticasone (FLONASE) 50 MCG/ACT nasal spray       . fluticasone (FLOVENT HFA) 110 MCG/ACT inhaler Inhale 1 puff into the lungs as needed.      Marland Kitchen levofloxacin (LEVAQUIN) 500 MG tablet       . Multiple Vitamin (MULTIVITAMIN) tablet Take 1 tablet by mouth every morning.       . norethindrone-ethinyl estradiol 1/35 (ORTHO-NOVUM, NORTREL,CYCLAFEM) tablet Take 1 tablet by mouth every evening.  1 Package  5  . oxyCODONE-acetaminophen (PERCOCET/ROXICET) 5-325 MG per tablet        No current facility-administered medications for this visit.    History   Social History  . Marital Status: Married    Spouse Name: N/A    Number of Children: N/A  . Years of Education: N/A   Occupational History  . Not on file.   Social History Main Topics  . Smoking status: Never Smoker   . Smokeless tobacco: Never Used  . Alcohol Use: 1.2 oz/week    1 Glasses of wine, 1 Shots of liquor per week     Comment: OCCASIONAL  . Drug Use: No  . Sexual Activity: Yes    Birth Control/ Protection: Pill   Other Topics Concern  . Not on file   Social History Narrative  . No narrative on file    No family history on file.    CLARKE-PEARSON,Skylor Hughson L, MD 08/29/2014, 8:53 AM

## 2014-09-23 NOTE — Anesthesia Procedure Notes (Signed)
Procedure Name: Intubation Date/Time: 09/23/2014 7:41 AM Performed by: Ofilia Neas Pre-anesthesia Checklist: Patient identified, Emergency Drugs available, Suction available, Patient being monitored and Timeout performed Patient Re-evaluated:Patient Re-evaluated prior to inductionOxygen Delivery Method: Circle system utilized Preoxygenation: Pre-oxygenation with 100% oxygen Intubation Type: IV induction and Rapid sequence Laryngoscope Size: Mac and 4 Grade View: Grade I Tube type: Oral Tube size: 7.5 mm Number of attempts: 1 Airway Equipment and Method: Stylet Placement Confirmation: positive ETCO2,  ETT inserted through vocal cords under direct vision and breath sounds checked- equal and bilateral Secured at: 21 cm Tube secured with: Tape Dental Injury: Teeth and Oropharynx as per pre-operative assessment

## 2014-09-23 NOTE — Interval H&P Note (Signed)
History and Physical Interval Note:  09/23/2014 7:07 AM  [name]  has presented today for surgery, with the diagnosis of ADENOSARCOMA  The various methods of treatment have been discussed with the patient and family. After consideration of risks, benefits and other options for treatment, the patient has consented to  Procedure(s): HYSTERECTOMY VAGINAL (N/A) as a surgical intervention .  The patient's history has been reviewed, patient examined, no change in status, stable for surgery.  I have reviewed the patient's chart and labs.  Questions were answered to the patient's satisfaction.     CLARKE-PEARSON,Almus Woodham L

## 2014-09-23 NOTE — Interval H&P Note (Signed)
History and Physical Interval Note:  09/23/2014 7:07 AM  [name]  has presented today for surgery, with the diagnosis of ADENOSARCOMA  The various methods of treatment have been discussed with the patient and family. After consideration of risks, benefits and other options for treatment, the patient has consented to  Procedure(s): HYSTERECTOMY VAGINAL (N/A) as a surgical intervention .  The patient's history has been reviewed, patient examined, no change in status, stable for surgery.  I have reviewed the patient's chart and labs.  Questions were answered to the patient's satisfaction.     CLARKE-PEARSON,Neda Willenbring L

## 2014-09-23 NOTE — H&P (View-Only) (Signed)
Pt notified she does not need to be on clear liquids only the day before surgery but no food or liquids after midnight.

## 2014-09-23 NOTE — Op Note (Signed)
MEKIYAH GLADWELL  female MEDICAL RECORD GY:[id number] DATE OF BIRTH: 10/07/1981 PHYSICIAN: Marti Sleigh, M.D  09/23/2014   OPERATIVE REPORT  PREOPERATIVE DIAGNOSIS: adenosarcoma of the uterus. Menometrorrhagia.  POSTOPERATIVE DIAGNOSIS:same  PROCEDURE: total vaginal hysterectomy  SURGEON: Marti Sleigh, M.D ASSISTANT: Lahoma Crocker, MD ANESTHESIA: Gen. With oral tracheal tube ESTIMATED BLOOD LOSS:50 mL  SURGICAL FINDINGS:the uterus was normal size. Thebladder flap was scarred from prior cesarean section. No other abnormal findings were noted.  PROCEDURE:the patient brought to the operating room and after satisfactory attainment of general anesthesia was placed in a lithotomy position in [place]. The vulva perineum and vagina were prepped with Betadine and the patient was draped. A Foley catheter was inserted. Surgical timeout was taken and antibiotics administered. Weighted speculum was placed in posterior vagina and a Deaver elevated the anterior vagina. The cervix was grasped with Lahey clamps. The cervical vaginal junction was injected with 1% Xylocaine with epinephrine. A circumferential incision was made in the vagina from 9 to 3:00 and the Rankin between the bladder and cervix developed. The posterior cul-de-sac was entered sharply in the midline was tagged with a interrupted suture of 2-0 Vicryl which was held. Uterosacral ligaments were clamped cut and suture ligated and held. In a stepwise fashion the cardinal ligaments and uterine vessels were clamped cut and suture ligated. The upper pedicles were crossclamped and suture ligated and free tied. The uterus was delivered from the surgical field. The uterosacral ligaments were then incorporated into the vaginal angles.the uterosacral ligaments were plicated across the midline. Surgical field was inspected and hemostatic except for some bleeding from the vaginal cuff. The vagina was then closed in a horizontal  fashion with interrupted sutures of 2-0 Vicryl. Good apical support was ascertained. Patient was awakened from anesthesia and taken to recovery room in satisfactory condition. Sponge needle and instrument counts correct.   Marti Sleigh, M.D

## 2014-09-23 NOTE — Plan of Care (Signed)
Problem: Phase I Progression Outcomes Goal: Pain controlled with appropriate interventions Outcome: Completed/Met Date Met:  09/23/14 Goal: Incision/dressings dry and intact Outcome: Not Applicable Date Met:  51/10/21 Goal: Sutures/staples intact Outcome: Not Applicable Date Met:  11/73/56 Goal: Initial discharge plan identified Outcome: Completed/Met Date Met:  09/23/14

## 2014-09-23 NOTE — Plan of Care (Signed)
Problem: Phase I Progression Outcomes Goal: Tubes/drains patent Outcome: Not Applicable Date Met:  83/66/29

## 2014-09-23 NOTE — Transfer of Care (Signed)
Immediate Anesthesia Transfer of Care Note  Patient: [name]  Procedure(s) Performed: Procedure(s): HYSTERECTOMY VAGINAL (N/A)  Patient Location: PACU  Anesthesia Type:General  Level of Consciousness: awake, oriented, patient cooperative, lethargic and responds to stimulation  Airway & Oxygen Therapy: Patient Spontanous Breathing and Patient connected to face mask oxygen  Post-op Assessment: Report given to PACU RN, Post -op Vital signs reviewed and stable and Post -op Vital signs reviewed and unstable, Anesthesiologist notified  Post vital signs: stable  Complications: No apparent anesthesia complications

## 2014-09-24 ENCOUNTER — Encounter (HOSPITAL_COMMUNITY): Payer: Self-pay | Admitting: Gynecology

## 2014-09-24 DIAGNOSIS — N800 Endometriosis of uterus: Secondary | ICD-10-CM

## 2014-09-24 DIAGNOSIS — C55 Malignant neoplasm of uterus, part unspecified: Secondary | ICD-10-CM | POA: Diagnosis not present

## 2014-09-24 NOTE — Plan of Care (Signed)
Problem: Phase I Progression Outcomes Goal: Vital signs/hemodynamically stable Outcome: Completed/Met Date Met:  09/24/14     

## 2014-09-24 NOTE — Discharge Summary (Signed)
Physician Discharge Summary  Patient ID: [name] MRN: [id number] DOB/AGE: 1981-02-08 33 y.o.  Admit date: 09/23/2014 Discharge date: 09/24/2014  Admission Diagnoses: Abnormal uterine bleeding  Discharge Diagnoses:  Principal Problem:   Abnormal uterine bleeding   Discharged Condition:  The patient is in good condition and stable for discharge.    Hospital Course: On 09/23/2014, the patient underwent the following: Procedure(s): HYSTERECTOMY VAGINAL.  The postoperative course was uneventful.  She was discharged to home on postoperative day 1 tolerating a regular diet.  Consults: None  Significant Diagnostic Studies: None  Treatments: surgery: see above  Discharge Exam: Blood pressure 102/51, pulse 92, temperature 98.2 F (36.8 C), temperature source Oral, resp. rate 20, height 5\' 5"  (1.651 m), weight 181 lb (82.101 kg), last menstrual period 08/21/2014, SpO2 99 %. General appearance: alert, cooperative and no distress Resp: clear to auscultation bilaterally Cardio: regular rate and rhythm, S1, S2 normal, no murmur, click, rub or gallop GI: soft, non-tender; bowel sounds normal; no masses,  no organomegaly Extremities: extremities normal, atraumatic, no cyanosis or edema  Disposition: 01-Home or Self Care  Discharge Instructions    Call MD for:  extreme fatigue    Complete by:  As directed      Call MD for:  persistant dizziness or light-headedness    Complete by:  As directed      Call MD for:  persistant nausea and vomiting    Complete by:  As directed      Call MD for:  redness, tenderness, or signs of infection (pain, swelling, redness, odor or green/yellow discharge around incision site)    Complete by:  As directed      Call MD for:  severe uncontrolled pain    Complete by:  As directed      Call MD for:  temperature >100.4    Complete by:  As directed      Diet general    Complete by:  As directed      Driving Restrictions    Complete by:  As directed    No driving for 1- 2 weeks     Increase activity slowly    Complete by:  As directed      Lifting restrictions    Complete by:  As directed   No lifting > 5 lbs for 6 weeks     May shower / Bathe    Complete by:  As directed   No tub baths for 6 weeks     May walk up steps    Complete by:  As directed      Sexual Activity Restrictions    Complete by:  As directed   No intercourse for 6 weeks            Medication List    TAKE these medications        albuterol 108 (90 BASE) MCG/ACT inhaler  Commonly known as:  PROVENTIL HFA;VENTOLIN HFA  Inhale 1-2 puffs into the lungs every 4 (four) hours as needed for wheezing or shortness of breath.     cetirizine 10 MG tablet  Commonly known as:  ZYRTEC  Take 10 mg by mouth every morning.     clobetasol cream 0.05 %  Commonly known as:  TEMOVATE  Apply 1 application topically 2 (two) times daily.     fluticasone 50 MCG/ACT nasal spray  Commonly known as:  FLONASE  Place 2 sprays into both nostrils every morning.     hydrOXYzine 25 MG  tablet  Commonly known as:  ATARAX/VISTARIL  Take 25 mg by mouth every 6 (six) hours as needed for itching.     multivitamin tablet  Take 1 tablet by mouth every morning.     norethindrone-ethinyl estradiol 1/35 tablet  Commonly known as:  ORTHO-NOVUM, NORTREL,CYCLAFEM  Take 1 tablet by mouth every evening.     OVER THE COUNTER MEDICATION  Take 1 tablet by mouth 2 (two) times daily as needed (allergies/sinus). Multi-Purpose Sinus Relief.     oxyCODONE-acetaminophen 5-325 MG per tablet  Commonly known as:  PERCOCET  Take 2 tablets by mouth every 6 (six) hours as needed for severe pain.     triamcinolone ointment 0.5 %  Commonly known as:  KENALOG  Apply 1 application topically 2 (two) times daily.           Follow-up Information    Follow up with Alvino Chapel, MD.   Specialty:  Gynecology   Contact information:   [address] [place] [address] [phone]        Greater than thirty minutes were spend for face to face discharge instructions and discharge orders/summary in EPIC.   Signed: Alima Naser DEAL 09/24/2014, 9:22 AM

## 2014-09-24 NOTE — Plan of Care (Signed)
Problem: Phase I Progression Outcomes Goal: Voiding-avoid urinary catheter unless indicated Outcome: Completed/Met Date Met:  09/24/14     

## 2014-09-24 NOTE — Plan of Care (Signed)
Problem: Phase I Progression Outcomes Goal: OOB as tolerated unless otherwise ordered Outcome: Completed/Met Date Met:  09/24/14

## 2014-09-24 NOTE — Discharge Instructions (Signed)
09/23/2014  Return to work: 4 - 6 weeks  Activity: 1. Be up and out of the bed during the day.  Take a nap if needed.  You may walk up steps but be careful and use the hand rail.  Stair climbing will tire you more than you think, you may need to stop part way and rest.   2. No lifting or straining for 6 weeks.  3. No driving for 2 weeks.  Do Not drive if you are taking narcotic pain medicine.  4. Shower daily.  Use soap and water on your incision and pat dry; don't rub.   5. No sexual activity and nothing in the vagina for 6 weeks.  Diet: 1. Low sodium Heart Healthy Diet is recommended.  2. It is safe to use a laxative if you have difficulty moving your bowels.    Reasons to call the Doctor:   Fever - Oral temperature greater than 100.4 degrees Fahrenheit  Foul-smelling vaginal discharge  Difficulty urinating  Nausea and vomiting  Increased pain at the site of the incision that is unrelieved with pain medicine.  Difficulty breathing with or without chest pain  New calf pain especially if only on one side  Sudden, continuing increased vaginal bleeding with or without clots.     Contacts: For questions or concerns you should contact:  Dr. Lahoma Crocker at [phone]  Dr. Marti Sleigh at [facility]  Vaginal Hysterectomy, Care After Refer to this sheet in the next few weeks. These instructions provide you with information on caring for yourself after your procedure. Your health care provider may also give you more specific instructions. Your treatment has been planned according to current medical practices, but problems sometimes occur. Call your health care provider if you have any problems or questions after your procedure. WHAT TO EXPECT AFTER THE PROCEDURE After your procedure, it is typical to have the following: 9. Abdominal pain. You will be given pain medicine to control it. 10. Sore throat from the breathing tube that was inserted  during surgery. HOME CARE INSTRUCTIONS  Only take over-the-counter or prescription medicines for pain, discomfort, or fever as directed by your health care provider.  Do not take aspirin. It can cause bleeding.  Do not drive when taking pain medicine.  Follow your health care provider's advice regarding diet, exercise, lifting, driving, and general activities.  Resume your usual diet as directed and allowed.  Get plenty of rest and sleep.  Do not douche, use tampons, or have sexual intercourse for at least 6 weeks, or until your health care provider gives you permission.  Change your bandages (dressings) as directed by your health care provider.  Monitor your temperature and notify your health care provider of a fever.  Take showers instead of baths for 2-3 weeks.  Do not drink alcohol until your health care provider gives you permission.  If you develop constipation, you may take a mild laxative with your health care provider's permission. Bran foods may help with constipation problems. Drinking enough fluids to keep your urine clear or pale yellow may help as well.  Try to have someone home with you for 1-2 weeks to help around the house.  Keep all of your follow-up appointments as directed by your health care provider. SEEK MEDICAL CARE IF:   You have swelling, redness, or increasing pain around your incision sites.  You have pus coming from your incision.  You notice a bad smell coming from your incision.  Your incision  breaks open.  You feel dizzy or lightheaded.  You have pain or bleeding when you urinate.  You have persistent diarrhea.  You have persistent nausea and vomiting.  You have abnormal vaginal discharge.  You have a rash.  You have any type of abnormal reaction or develop an allergy to your medicine.  You have poor pain control with your prescribed medicine. SEEK IMMEDIATE MEDICAL CARE IF:   You have a fever.  You have severe abdominal  pain.  You have chest pain.  You have shortness of breath.  You faint.  You have pain, swelling, or redness in your leg.  You have heavy vaginal bleeding with blood clots. MAKE SURE YOU:  Understand these instructions.  Will watch your condition.  Will get help right away if you are not doing well or get worse. Document Released: 10/27/2011 Document Revised: 11/12/2013 Document Reviewed: 05/23/2013 [facility] Patient Information 2015 [place], [place]. This information is not intended to replace advice given to you by your health care provider. Make sure you discuss any questions you have with your health care provider.

## 2014-09-24 NOTE — Plan of Care (Signed)
Problem: Phase II Progression Outcomes Goal: Vital signs stable Outcome: Completed/Met Date Met:  09/24/14     

## 2014-09-24 NOTE — Plan of Care (Signed)
Problem: Phase II Progression Outcomes Goal: Pain controlled Outcome: Completed/Met Date Met:  09/24/14

## 2014-09-24 NOTE — Progress Notes (Signed)
Discharge instructions reviewed with patient and spouse, prescription given to patient, vital signs are stable,  Questions and concerns answered, patient to follow up with MD Neta Mends RN 11:42 AM 09-24-2014

## 2014-09-29 ENCOUNTER — Telehealth: Payer: Self-pay | Admitting: *Deleted

## 2014-09-29 ENCOUNTER — Ambulatory Visit: Payer: 59

## 2014-09-29 DIAGNOSIS — N390 Urinary tract infection, site not specified: Secondary | ICD-10-CM

## 2014-09-29 LAB — URINALYSIS, MICROSCOPIC - CHCC
Bilirubin (Urine): NEGATIVE
Glucose: NEGATIVE mg/dL
Ketones: NEGATIVE mg/dL
NITRITE: NEGATIVE
PROTEIN: NEGATIVE mg/dL
SPECIFIC GRAVITY, URINE: 1.015 (ref 1.003–1.035)
UROBILINOGEN UR: 0.2 mg/dL (ref 0.2–1)
pH: 6.5 (ref 4.6–8.0)

## 2014-09-29 NOTE — Telephone Encounter (Signed)
Pt called having burning/pain with urination, pt will call back regarding time she can come in today to give urine specimen.

## 2014-09-29 NOTE — Telephone Encounter (Signed)
Instructed pt to use peri bottle filled with warm water to perineum with urination, will call pt with results of culture as soon as available.pt verbalized understanding.

## 2014-09-29 NOTE — Telephone Encounter (Signed)
Pt advised she can be here at 12 to give specimen. appt made orders entered

## 2014-09-30 ENCOUNTER — Other Ambulatory Visit: Payer: Self-pay | Admitting: *Deleted

## 2014-09-30 ENCOUNTER — Telehealth: Payer: Self-pay | Admitting: *Deleted

## 2014-09-30 LAB — URINE CULTURE

## 2014-09-30 MED ORDER — SULFAMETHOXAZOLE-TRIMETHOPRIM 800-160 MG PO TABS: 1.0000 | ORAL_TABLET | Freq: Two times a day (BID) | ORAL | Status: DC

## 2014-09-30 NOTE — Telephone Encounter (Signed)
Pain with urination, rx for bactrim sent to pt pharmacy per provider

## 2014-09-30 NOTE — Telephone Encounter (Signed)
Urine Culture came back negative called pt advised she will not need to take antibiotic. Pt reported she has not picked up medication and we again discussed peri bottle with warm water when urinating, heating pad and ibuprofen for any discomfort. Pt verbalized understanding, no further concerns.

## 2014-10-06 ENCOUNTER — Encounter: Payer: Self-pay | Admitting: Gynecologic Oncology

## 2014-10-09 ENCOUNTER — Encounter: Payer: Self-pay | Admitting: Gynecologic Oncology

## 2014-10-09 ENCOUNTER — Telehealth: Payer: Self-pay | Admitting: Gynecologic Oncology

## 2014-10-09 NOTE — Telephone Encounter (Signed)
Returned call to patient.  Patient was requesting a temporary handicap tag post-operatively due to the very long distance she had to walk into work.  Dr. Fermin Schwab approved for her to have a temp pass for 6 weeks post-op.  Patient notified and requesting the form be mailed to her house.  Advised to call for any questions or concerns.

## 2014-10-20 ENCOUNTER — Encounter: Payer: Self-pay | Admitting: Gynecology

## 2014-10-27 ENCOUNTER — Ambulatory Visit: Payer: 59 | Attending: Gynecology | Admitting: Gynecology

## 2014-10-27 ENCOUNTER — Encounter: Payer: Self-pay | Admitting: Gynecology

## 2014-10-27 VITALS — BP 117/69 | HR 71 | Temp 98.0°F | Resp 16 | Ht 66.0 in | Wt 178.1 lb

## 2014-10-27 DIAGNOSIS — Z859 Personal history of malignant neoplasm, unspecified: Secondary | ICD-10-CM | POA: Insufficient documentation

## 2014-10-27 DIAGNOSIS — J45909 Unspecified asthma, uncomplicated: Secondary | ICD-10-CM | POA: Diagnosis not present

## 2014-10-27 DIAGNOSIS — N800 Endometriosis of uterus: Secondary | ICD-10-CM

## 2014-10-27 DIAGNOSIS — Z79899 Other long term (current) drug therapy: Secondary | ICD-10-CM | POA: Insufficient documentation

## 2014-10-27 DIAGNOSIS — Z09 Encounter for follow-up examination after completed treatment for conditions other than malignant neoplasm: Secondary | ICD-10-CM | POA: Diagnosis present

## 2014-10-27 DIAGNOSIS — M109 Gout, unspecified: Secondary | ICD-10-CM | POA: Insufficient documentation

## 2014-10-27 DIAGNOSIS — Z8542 Personal history of malignant neoplasm of other parts of uterus: Secondary | ICD-10-CM | POA: Insufficient documentation

## 2014-10-27 DIAGNOSIS — Z90710 Acquired absence of both cervix and uterus: Secondary | ICD-10-CM

## 2014-10-27 NOTE — Patient Instructions (Signed)
Follow-up in 1 year. Please call sooner with any questions or concerns. Call us mid summer to schedule this appt.

## 2014-10-27 NOTE — Progress Notes (Signed)
Consult Note: Gyn-Onc   [name] 33 y.o. female  Chief Complaint  Patient presents with  . Routine Post Op    Follow up    Assessment : Status post vaginal hysterectomy 09/23/2014. Excellent postoperative recovery. The patient may return to full levels of activity. Plan  the patient given the okay to return to full levels of activity. She return to see me in one year.  Interval history. Patient returns today  for first postoperative checkup. She underwent a vaginal hysterectomy 09/23/2014 for abnormal uterine bleeding and history of an adenosarcoma of the uterus. Her surgery went well and final pathology showed adenomyosis but no evidence of an adenosarcoma. Her postoperative course is been uncomplicated. She's return to work full-time last week. She does feel somewhat tired. She denies any vaginal bleeding or any pelvic pain. She has normal GI and GU function. HPI:The patient's gynecologic history dates back to 2006 when she presented with heavy bleeding. A D&C showed an adenosarcoma. The patient wished to preserve fertility and was therefore treated with high-dose progestins and followup D&C in March 2006 showing a minute fragment of adenosarcoma. We continued progestins and a repeat D&C in July 2006 was negative we continue to follow patient with ultrasounds every 6 months. She subsequently was able to get pregnant and now has a 29-year-old son. Over the intervening years the patient was taking Ortho Tri-Cyclen and then more recently Nortrel 1/35. She did well throughout the 4 years she took Nortrel. In April 2014 the patient chose to switch oral contraceptives to Seasonale.  Subsequent to starting Seasonale she began to have irregular bleeding nearly every day. This was evaluated on 05/20/2013 with an endometrial biopsy showing benign suppressed endometrium with increased progesterone effect and an ultrasound showing a uterus that measured 7.1 x 5 cm, an endometrial stripe of 9 mm but  with no visualized uterine abnormalities. Both ovaries appeared normal. Pap smear was also normal.  She underwent a D&C on 01/31/2014 to evaluate the abnormal bleeding. Final pathology showed secretory endometrium and no evidence of adenosarcoma.  Ultimately she underwent a vaginal hysterectomy on 09/23/2014 for management of continued abnormal bleeding. There is no evidence of adenosarcoma in the hysterectomy specimen.    Review of Systems:10 point review of systems is negative except as noted in interval history.   Vitals: Blood pressure 117/69, pulse 71, temperature 98 F (36.7 C), resp. rate 16, height 5\' 6"  (1.676 m), weight 178 lb 1.6 oz (80.786 kg).  Physical Exam: General : The patient is a healthy woman in no acute distress.  HEENT: normocephalic, extraoccular movements normal; neck is supple without thyromegally  Lynphnodes: Supraclavicular and inguinal nodes not enlarged  Abdomen: Soft, non-tender, no ascites, no organomegally, no masses, no hernias  Pelvic:  EGBUS: Normal female  Vagina: Normal, no lesions , the cuff is well-healed. There are still a few Vicryl sutures remaining. Urethra and Bladder: Normal, non-tender  Cervix: Surgically absent Uterus: Surgically absent. There is no tenderness. Bi-manual examination: Non-tender; no adenxal masses or nodularity  Rectal: normal sphincter tone, no masses, no blood  Lower extremities: No edema or varicosities. Normal range of motion      No Known Allergies  Past Medical History  Diagnosis Date  . DUB (dysfunctional uterine bleeding)   . 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 PONV (postoperative nausea and vomiting)   . Asthma     has only had one episode  ever - allergy induced  . History of gout   . Chigger bites     Past Surgical History  Procedure Laterality Date  . D & c hysteroscopy/ endocervical curettage  01-25-2005  . Eua/  dx  hysteroscopy/  d & c  06-17-2005  .  Dilation and curettage of uterus N/A 01/31/2014    Procedure: DILATATION AND CURETTAGE;  Surgeon: Alvino Chapel, MD;  Location: [facility];  Service: Gynecology;  Laterality: N/A;  . Cysto  AGE 64    UNSURE OF TYPE OF SURGERY  . Cesarean section    . Vaginal hysterectomy N/A 09/23/2014    Procedure: HYSTERECTOMY VAGINAL;  Surgeon: Alvino Chapel, MD;  Location: [facility];  Service: Gynecology;  Laterality: N/A;    Current Outpatient Prescriptions  Medication Sig Dispense Refill  . albuterol (PROVENTIL HFA;VENTOLIN HFA) 108 (90 BASE) MCG/ACT inhaler Inhale 1-2 puffs into the lungs every 4 (four) hours as needed for wheezing or shortness of breath.    . cetirizine (ZYRTEC) 10 MG tablet Take 10 mg by mouth every morning.     . fluticasone (FLONASE) 50 MCG/ACT nasal spray Place 2 sprays into both nostrils every morning.     . Multiple Vitamin (MULTIVITAMIN) tablet Take 1 tablet by mouth every morning.     Marland Kitchen OVER THE COUNTER MEDICATION Take 1 tablet by mouth 2 (two) times daily as needed (allergies/sinus). Multi-Purpose Sinus Relief.    . sulfamethoxazole-trimethoprim (BACTRIM DS,SEPTRA DS) 800-160 MG per tablet      No current facility-administered medications for this visit.    History   Social History  . Marital Status: Married    Spouse Name: N/A    Number of Children: N/A  . Years of Education: N/A   Occupational History  . Not on file.   Social History Main Topics  . Smoking status: Never Smoker   . Smokeless tobacco: Never Used  . Alcohol Use: 1.2 oz/week    1 Glasses of wine, 1 Shots of liquor per week     Comment: OCCASIONAL  . Drug Use: No  . Sexual Activity: Yes    Birth Control/ Protection: Pill   Other Topics Concern  . Not on file   Social History Narrative    History reviewed. No pertinent family history.    CLARKE-PEARSON,Nyiesha Beever L, MD 10/27/2014, 9:03 AM

## 2015-01-11 IMAGING — US US TRANSVAGINAL NON-OB
1 series · 14 of 25 positions shown · non-contrast
Comparison: None.

CLINICAL DATA: Adenosarcoma of the uterus.  Vaginal bleeding.

EXAM:
TRANSABDOMINAL AND TRANSVAGINAL ULTRASOUND OF PELVIS
TECHNIQUE: Transabdominal and transvaginal ultrasound examination of the pelvis
was performed including evaluation of the uterus, ovaries, adnexal
regions, and pelvic cul-de-sac. The transvaginal portion of the
examination was performed to visualize the ovaries

[Series 1: us transvaginal non-ob · 0.22mm/px · 69 acquisitions, 14 frames shown]
[im 1/69]
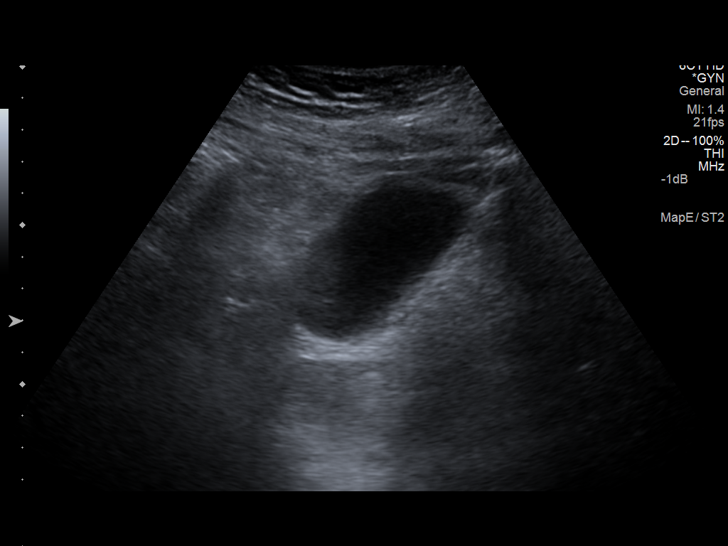
[im 6/69]
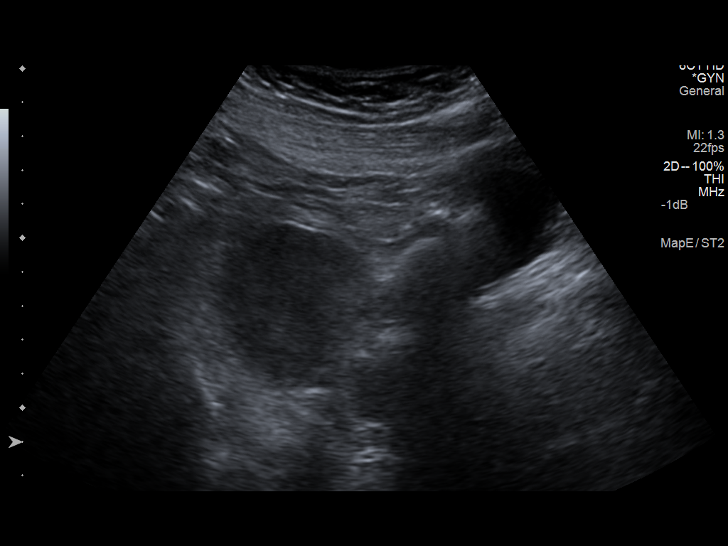
[im 12/69]
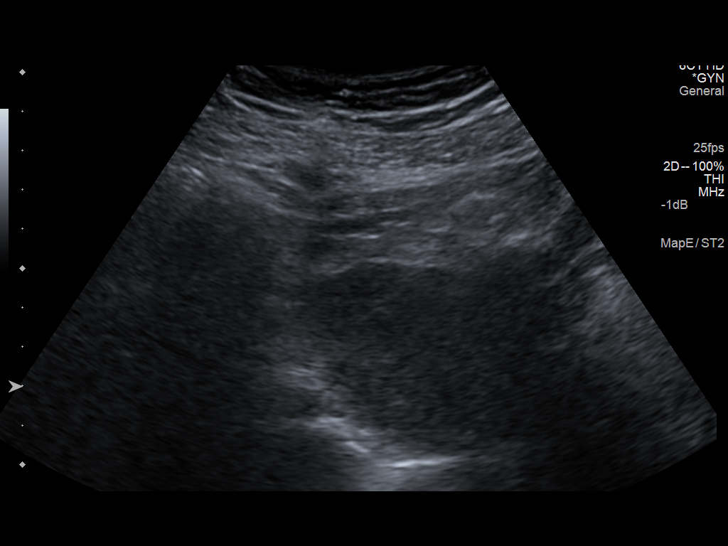
[im 18/69]
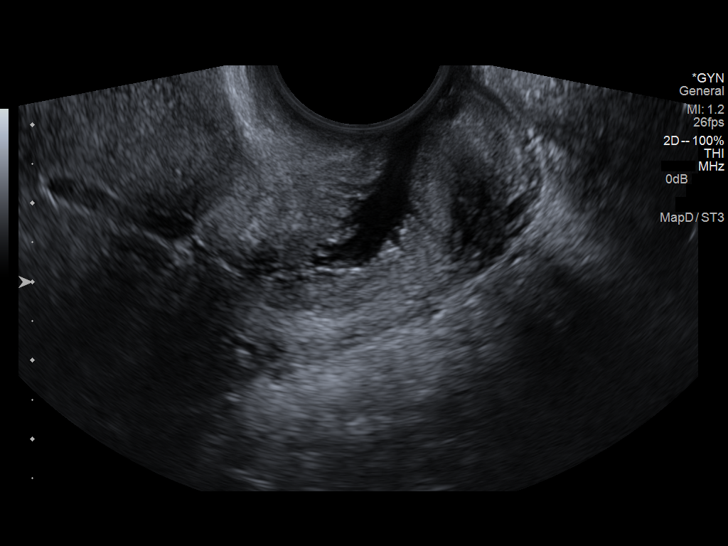
[im 23/69]
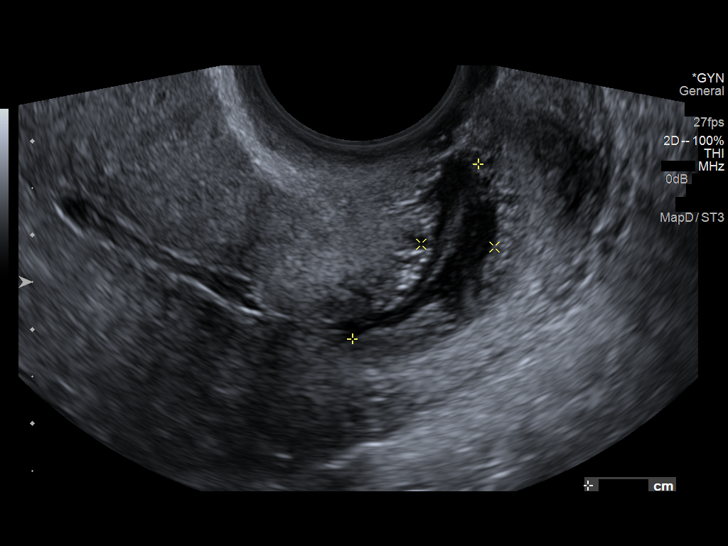
[im 26/69]
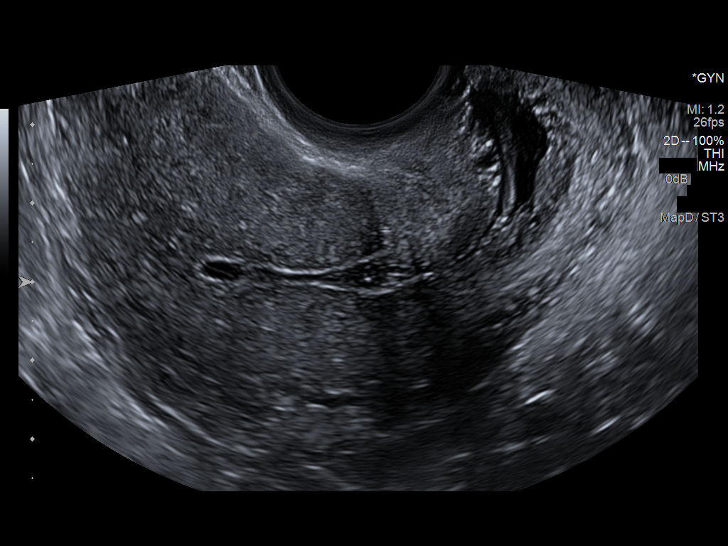
[im 32/69]
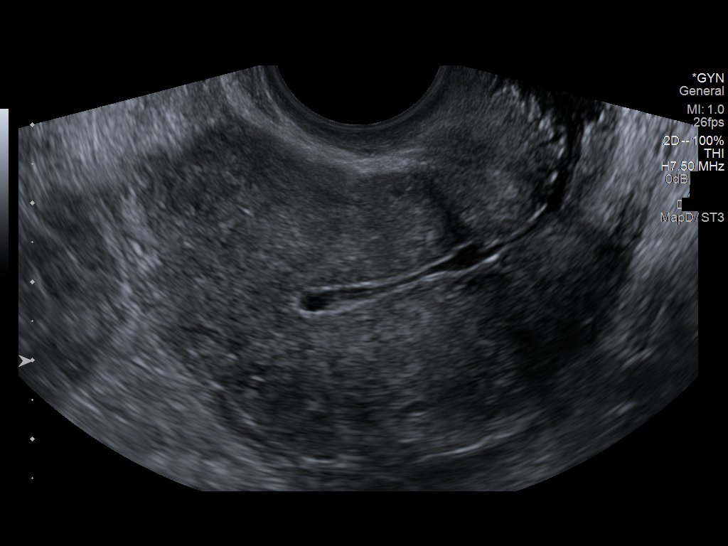
[im 37/69]
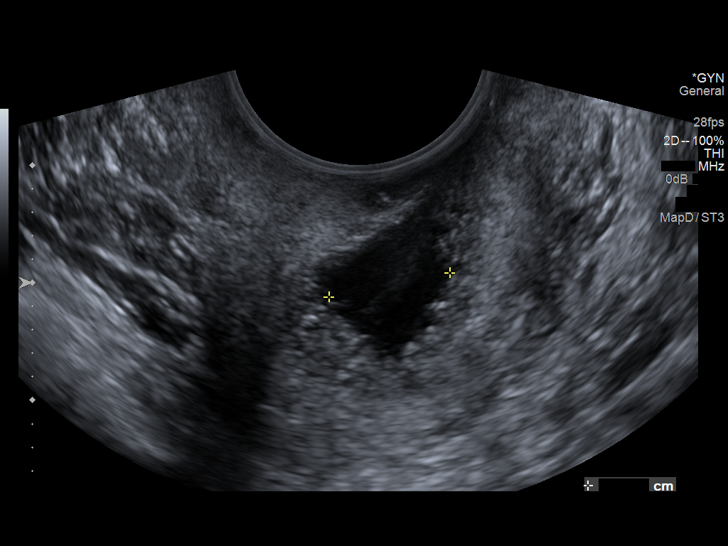
[im 43/69]
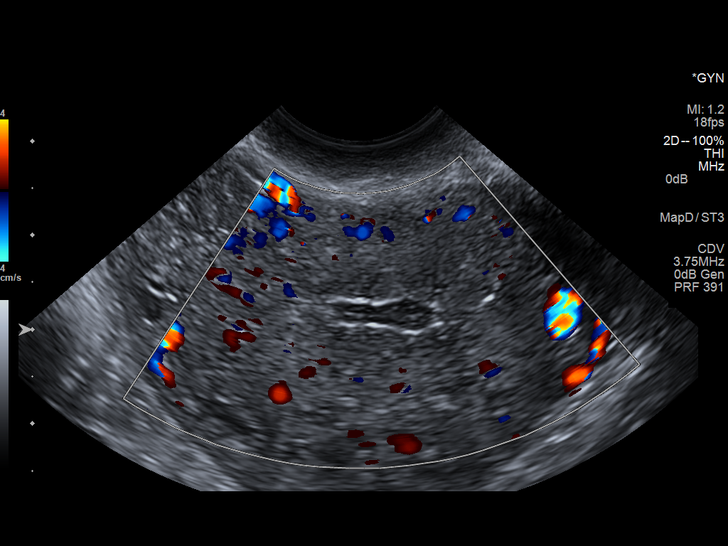
[im 46/69]
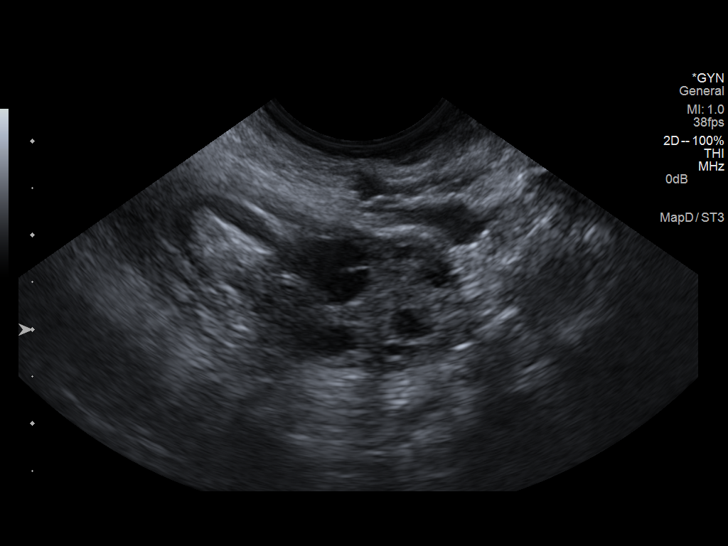
[im 52/69]
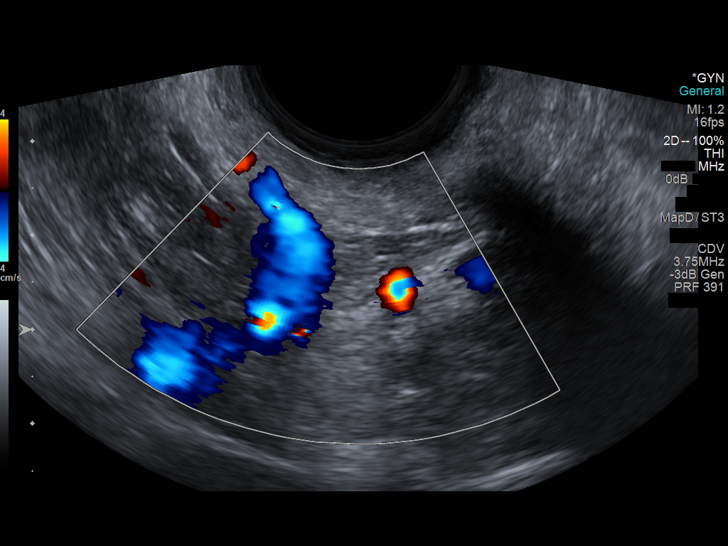
[im 57/69]
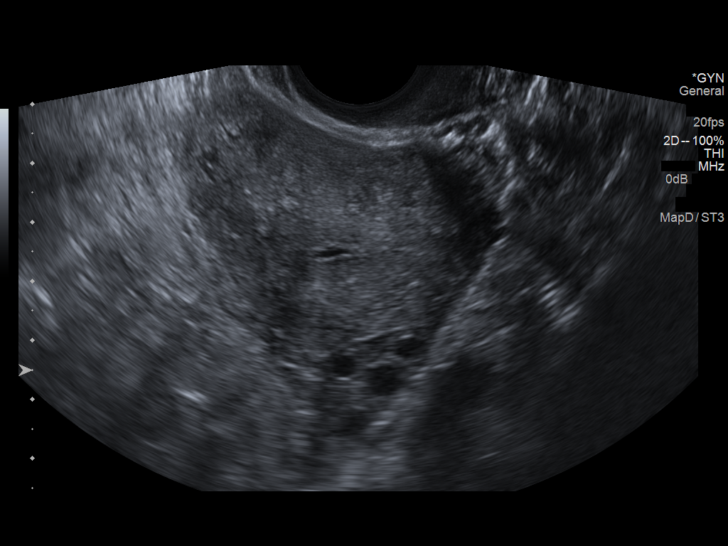
[im 63/69]
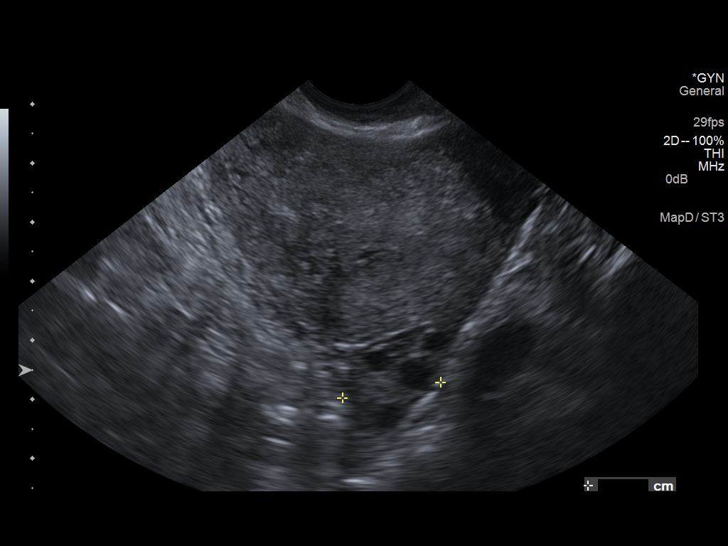
[im 69/69]
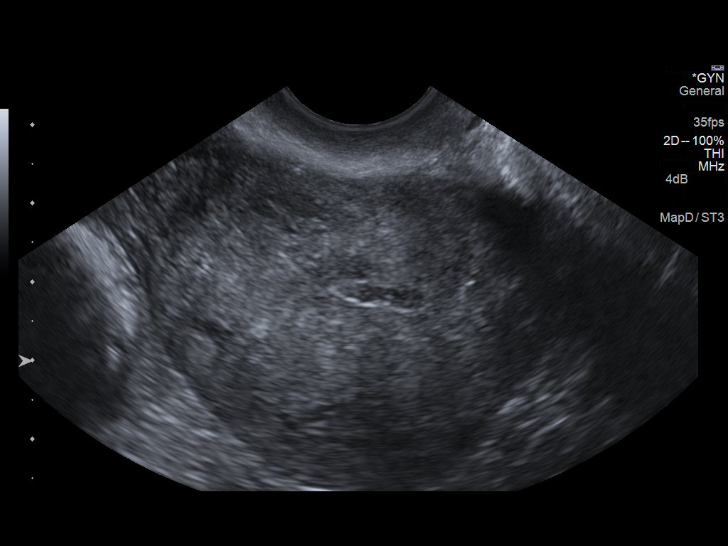

[14 of 25 positions shown; findings below may reference images not displayed]

FINDINGS: Uterus

Measurements: 7.3 x 4.6 x 4.6 cm. There is a heterogeneous
echotexture.. No fibroids or other mass visualized.

Endometrium

Thickness: 1.6 mm.. Mildly complex fluid identified within the
endometrial cavity

Right ovary

Measurements: 2.8 x 1.7 x 1.7 cm. Normal appearance/no adnexal mass.

Left ovary

Measurements: 3.3 x 1.6 x 1.7 cm.. Normal appearance/no adnexal
mass.

Other findings:  No free fluid
IMPRESSION: 1. No discrete mass identified.
2. Mildly complex fluid is identified within the endometrium and
cervical canal.

## 2024-06-06 ENCOUNTER — Other Ambulatory Visit: Payer: Self-pay

## 2024-06-06 ENCOUNTER — Ambulatory Visit
Admission: EM | Admit: 2024-06-06 | Discharge: 2024-06-06 | Disposition: A | Discharge status: Home / Self Care | Attending: Internal Medicine | Admitting: Internal Medicine

## 2024-06-06 DIAGNOSIS — J069 Acute upper respiratory infection, unspecified: Secondary | ICD-10-CM

## 2024-06-06 DIAGNOSIS — J3489 Other specified disorders of nose and nasal sinuses: Secondary | ICD-10-CM

## 2024-06-06 MED ORDER — PREDNISONE 20 MG PO TABS: 40.0000 mg | ORAL_TABLET | Freq: Every day | ORAL | 0 refills | Status: AC

## 2024-06-06 NOTE — Discharge Instructions (Signed)
 As we discussed, it appears that you may have a viral illness that should run its course. I have prescribed a steroid to help decrease sinus pressure and inflammation. Follow up if symptoms persist or worsen.

## 2024-06-06 NOTE — ED Triage Notes (Signed)
 Pt states she has had sinus pressure x 5 days. Pt c/o cough overnight, fatigue, and excessive sweating. Pt states she feels like she has a fever but has not taken her temp. Pt took Claritin D and said that it is not helping at all. She states that she has used nasal spray flonase and azelastine and they do not help either. Pt is going on a work trip next Monday and wants to get checked out.

## 2024-06-06 NOTE — ED Provider Notes (Signed)
 [facility]    CSN: [id number] Arrival date & time: 06/06/24  1209      History   Chief Complaint Chief Complaint  Patient presents with   sinus pressure   Excessive Sweating   Cough   Fatigue    HPI [name] is a 43 y.o. female.   Patient presents with 5 day history of nasal congestion, sinus pressure, cough, ear discomfort. She states that she has felt feverish as well as she has been sweating but has not checked her temperature with a thermometer. Her husband and son have similar symptoms. She does have a history of asthma that is induced when she has a respiratory illness but states that she has not felt like she has needed her inhaler. She has taken Claritin-D, flonase, azelastine with no improvement. Cough was brief and has now resolved.    Cough   Past Medical History:  Diagnosis Date   Adenosarcoma of uterus (HCC)    HX  MARCH  2006--- [place]  HIGH DOSE PROGESTIN'S   Asthma    has only had one episode ever - allergy induced   Chigger bites    DUB (dysfunctional uterine bleeding)    History of gout    PONV (postoperative nausea and vomiting)     Patient Active Problem List   Diagnosis Date Noted   Abnormal uterine bleeding 09/23/2014   Adenosarcoma of uterus (HCC) 08/29/2014   History of atypical nevus 07/24/2013   Uterine bleeding, dysfunctional 06/14/2013   Allergic rhinitis 01/06/2012   Airway hyperreactivity 01/06/2012   Headache, migraine 01/06/2012    Past Surgical History:  Procedure Laterality Date   CESAREAN SECTION     CYSTO  AGE 68   UNSURE OF TYPE OF SURGERY   D & C HYSTEROSCOPY/ ENDOCERVICAL CURETTAGE  01-25-2005   DILATION AND CURETTAGE OF UTERUS N/A 01/31/2014   Procedure: DILATATION AND CURETTAGE;  Surgeon: Toribio LITTIE Percy, MD;  Location: [facility];  Service: Gynecology;  Laterality: N/A;   EUA/  DX  HYSTEROSCOPY/  D & C  06-17-2005   VAGINAL HYSTERECTOMY N/A 09/23/2014   Procedure: HYSTERECTOMY  VAGINAL;  Surgeon: Toribio LITTIE Percy, MD;  Location: [facility];  Service: Gynecology;  Laterality: N/A;    OB History   No obstetric history on file.      Home Medications    Prior to Admission medications   Medication Sig Start Date End Date Taking? Authorizing Provider  predniSONE  (DELTASONE ) 20 MG tablet Take 2 tablets (40 mg total) by mouth daily for 5 days. 06/06/24 06/11/24 Yes Odis Wickey, Darryle BRAVO, FNP  albuterol  (PROVENTIL  HFA;VENTOLIN  HFA) 108 (90 BASE) MCG/ACT inhaler Inhale 1-2 puffs into the lungs every 4 (four) hours as needed for wheezing or shortness of breath.    [provider]  cetirizine (ZYRTEC) 10 MG tablet Take 10 mg by mouth every morning.     [provider]  fluticasone (FLONASE) 50 MCG/ACT nasal spray Place 2 sprays into both nostrils every morning.  01/06/14   [provider]  Multiple Vitamin (MULTIVITAMIN) tablet Take 1 tablet by mouth every morning.     [provider]  OVER THE COUNTER MEDICATION Take 1 tablet by mouth 2 (two) times daily as needed (allergies/sinus). Multi-Purpose Sinus Relief.    [provider]  sulfamethoxazole -trimethoprim  (BACTRIM  DS,SEPTRA  DS) 800-160 MG per tablet  09/30/14   [provider]    Family History History reviewed. No pertinent family history.  Social History Social  History   Tobacco Use   Smoking status: Never   Smokeless tobacco: Never  Substance Use Topics   Alcohol use: Yes    Alcohol/week: 2.0 standard drinks of alcohol    Types: 1 Glasses of wine, 1 Shots of liquor per week    Comment: OCCASIONAL   Drug use: No     Allergies   Patient has no known allergies.   Review of Systems Review of Systems Per HPI  Physical Exam Triage Vital Signs ED Triage Vitals  Encounter Vitals Group     BP 06/06/24 1226 109/72     Girls Systolic BP Percentile --      Girls Diastolic BP Percentile --      Boys Systolic BP Percentile --      Boys Diastolic BP  Percentile --      Pulse Rate 06/06/24 1226 79     Resp 06/06/24 1226 18     Temp 06/06/24 1226 98 F (36.7 C)     Temp Source 06/06/24 1226 Oral     SpO2 06/06/24 1226 98 %     Weight --      Height --      Head Circumference --      Peak Flow --      Pain Score 06/06/24 1223 6     Pain Loc --      Pain Education --      Exclude from Growth Chart --    No data found.  Updated Vital Signs BP 109/72 (BP Location: Right Arm)   Pulse 79   Temp 98 F (36.7 C) (Oral)   Resp 18   LMP 08/21/2014 (Approximate)   SpO2 98%   Visual Acuity Right Eye Distance:   Left Eye Distance:   Bilateral Distance:    Right Eye Near:   Left Eye Near:    Bilateral Near:     Physical Exam Constitutional:      General: She is not in acute distress.    Appearance: Normal appearance. She is not toxic-appearing or diaphoretic.  HENT:     Head: Normocephalic and atraumatic.     Right Ear: Ear canal normal. A middle ear effusion is present. Tympanic membrane is not perforated, erythematous or bulging.     Left Ear: Ear canal normal. A middle ear effusion is present. Tympanic membrane is not perforated, erythematous or bulging.     Nose: Congestion present.     Mouth/Throat:     Mouth: Mucous membranes are moist.     Pharynx: No posterior oropharyngeal erythema.  Eyes:     Extraocular Movements: Extraocular movements intact.     Conjunctiva/sclera: Conjunctivae normal.     Pupils: Pupils are equal, round, and reactive to light.  Cardiovascular:     Rate and Rhythm: Normal rate and regular rhythm.     Pulses: Normal pulses.     Heart sounds: Normal heart sounds.  Pulmonary:     Effort: Pulmonary effort is normal. No respiratory distress.     Breath sounds: Normal breath sounds. No wheezing.  Abdominal:     General: Abdomen is flat. Bowel sounds are normal.     Palpations: Abdomen is soft.  Musculoskeletal:        General: Normal range of motion.     Cervical back: Normal range of motion.   Skin:    General: Skin is warm and dry.  Neurological:     General: No focal deficit present.  Mental Status: She is alert and oriented to person, place, and time. Mental status is at baseline.  Psychiatric:        Mood and Affect: Mood normal.        Behavior: Behavior normal.      UC Treatments / Results  Labs (all labs ordered are listed, but only abnormal results are displayed) Labs Reviewed - No data to display  EKG   Radiology No results found.  Procedures Procedures (including critical care time)  Medications Ordered in UC Medications - No data to display  Initial Impression / Assessment and Plan / UC Course  I have reviewed the triage vital signs and the nursing notes.  Pertinent labs & imaging results that were available during my care of the patient were reviewed by me and considered in my medical decision making (see chart for details).     Patient presents with symptoms likely from a viral upper respiratory infection.  Do not suspect underlying cardiopulmonary process. Symptoms seem unlikely related to ACS, CHF or COPD exacerbations, pneumonia, pneumothorax. Patient is nontoxic appearing and not in need of emergent medical intervention. There are no signs of bacterial infection on exam that would warrant antibiotic therapy. Patient offered covid and flu testing but declined with shared decision making.   Will treat with steroid burst to see if this will be helpful with fluid behind TM's, sinus pressure, and to decrease inflammation.   Return if symptoms fail to improve.  Patient states understanding and is agreeable.  Discharged with PCP followup.  Final Clinical Impressions(s) / UC Diagnoses   Final diagnoses:  Sinus pressure  Viral upper respiratory infection     Discharge Instructions      As we discussed, it appears that you may have a viral illness that should run its course. I have prescribed a steroid to help decrease sinus pressure and  inflammation. Follow up if symptoms persist or worsen.     ED Prescriptions     Medication Sig Dispense Auth. Provider   predniSONE  (DELTASONE ) 20 MG tablet Take 2 tablets (40 mg total) by mouth daily for 5 days. 10 tablet Petronella Shuford E, [place]      PDMP not reviewed this encounter.   Hazen Darryle BRAVO, [place] 06/06/24 1250
